# Patient Record
Sex: Male | Born: 1950 | Race: White | Hispanic: No | Smoking: Never smoker
Health system: Southern US, Community
[De-identification: ages and names within clinical notes are randomized; demographics above are authoritative.]

## PROBLEM LIST (undated history)

## (undated) DIAGNOSIS — H409 Unspecified glaucoma: Secondary | ICD-10-CM

## (undated) DIAGNOSIS — M1990 Unspecified osteoarthritis, unspecified site: Secondary | ICD-10-CM

## (undated) DIAGNOSIS — I10 Essential (primary) hypertension: Secondary | ICD-10-CM

## (undated) DIAGNOSIS — E785 Hyperlipidemia, unspecified: Secondary | ICD-10-CM

## (undated) DIAGNOSIS — C61 Malignant neoplasm of prostate: Secondary | ICD-10-CM

## (undated) HISTORY — DX: Unspecified glaucoma: H40.9

## (undated) HISTORY — PX: PROSTATE BIOPSY: SHX241

## (undated) HISTORY — PX: TONSILLECTOMY: SUR1361

## (undated) HISTORY — PX: WISDOM TOOTH EXTRACTION: SHX21

## (undated) HISTORY — DX: Hyperlipidemia, unspecified: E78.5

---

## 2003-09-26 ENCOUNTER — Ambulatory Visit (HOSPITAL_COMMUNITY): Admission: RE | Admit: 2003-09-26 | Discharge: 2003-09-26 | Payer: Self-pay | Admitting: Chiropractic Medicine

## 2015-02-21 DIAGNOSIS — H401112 Primary open-angle glaucoma, right eye, moderate stage: Secondary | ICD-10-CM | POA: Diagnosis not present

## 2015-02-21 DIAGNOSIS — H2513 Age-related nuclear cataract, bilateral: Secondary | ICD-10-CM | POA: Diagnosis not present

## 2015-03-11 DIAGNOSIS — H6693 Otitis media, unspecified, bilateral: Secondary | ICD-10-CM | POA: Diagnosis not present

## 2015-03-14 DIAGNOSIS — H8113 Benign paroxysmal vertigo, bilateral: Secondary | ICD-10-CM | POA: Diagnosis not present

## 2015-03-14 DIAGNOSIS — J069 Acute upper respiratory infection, unspecified: Secondary | ICD-10-CM | POA: Diagnosis not present

## 2015-03-14 DIAGNOSIS — K529 Noninfective gastroenteritis and colitis, unspecified: Secondary | ICD-10-CM | POA: Diagnosis not present

## 2015-04-11 DIAGNOSIS — H2513 Age-related nuclear cataract, bilateral: Secondary | ICD-10-CM | POA: Diagnosis not present

## 2015-04-11 DIAGNOSIS — H401111 Primary open-angle glaucoma, right eye, mild stage: Secondary | ICD-10-CM | POA: Diagnosis not present

## 2015-04-30 ENCOUNTER — Emergency Department (HOSPITAL_COMMUNITY): Payer: Medicare HMO

## 2015-04-30 ENCOUNTER — Emergency Department (HOSPITAL_COMMUNITY)
Admission: EM | Admit: 2015-04-30 | Discharge: 2015-04-30 | Disposition: A | Discharge status: Home / Self Care | Payer: Medicare HMO | Attending: Emergency Medicine | Admitting: Emergency Medicine

## 2015-04-30 ENCOUNTER — Encounter (HOSPITAL_COMMUNITY): Payer: Self-pay | Admitting: Oncology

## 2015-04-30 DIAGNOSIS — R079 Chest pain, unspecified: Secondary | ICD-10-CM | POA: Diagnosis present

## 2015-04-30 DIAGNOSIS — R0789 Other chest pain: Secondary | ICD-10-CM | POA: Diagnosis not present

## 2015-04-30 DIAGNOSIS — K8020 Calculus of gallbladder without cholecystitis without obstruction: Secondary | ICD-10-CM

## 2015-04-30 DIAGNOSIS — Z7982 Long term (current) use of aspirin: Secondary | ICD-10-CM | POA: Diagnosis not present

## 2015-04-30 DIAGNOSIS — I10 Essential (primary) hypertension: Secondary | ICD-10-CM | POA: Insufficient documentation

## 2015-04-30 HISTORY — DX: Essential (primary) hypertension: I10

## 2015-04-30 LAB — BASIC METABOLIC PANEL
Anion gap: 9 (ref 5–15)
BUN: 19 mg/dL (ref 6–20)
CHLORIDE: 106 mmol/L (ref 101–111)
CO2: 23 mmol/L (ref 22–32)
CREATININE: 1.21 mg/dL (ref 0.61–1.24)
Calcium: 9.1 mg/dL (ref 8.9–10.3)
GFR calc Af Amer: >60 mL/min (ref >60)
GFR calc non Af Amer: >60 mL/min (ref >60)
Glucose, Bld: 155 mg/dL — ABNORMAL HIGH (ref 65–99)
Potassium: 4.7 mmol/L (ref 3.5–5.1)
Sodium: 138 mmol/L (ref 135–145)

## 2015-04-30 LAB — I-STAT TROPONIN, ED: Troponin i, poc: 0 ng/mL (ref 0.00–0.08)

## 2015-04-30 LAB — URINALYSIS, ROUTINE W REFLEX MICROSCOPIC
Bilirubin Urine: NEGATIVE
GLUCOSE, UA: NEGATIVE mg/dL
HGB URINE DIPSTICK: NEGATIVE
Ketones, ur: NEGATIVE mg/dL
Leukocytes, UA: NEGATIVE
Nitrite: NEGATIVE
PH: 6 (ref 5.0–8.0)
PROTEIN: NEGATIVE mg/dL
Specific Gravity, Urine: >1.046 — ABNORMAL HIGH (ref 1.005–1.030)

## 2015-04-30 LAB — HEPATIC FUNCTION PANEL
ALBUMIN: 4.2 g/dL (ref 3.5–5.0)
ALK PHOS: 74 U/L (ref 38–126)
ALT: 27 U/L (ref 17–63)
AST: 33 U/L (ref 15–41)
BILIRUBIN INDIRECT: 0.6 mg/dL (ref 0.3–0.9)
BILIRUBIN TOTAL: 0.9 mg/dL (ref 0.3–1.2)
Bilirubin, Direct: 0.3 mg/dL (ref 0.1–0.5)
Total Protein: 6.9 g/dL (ref 6.5–8.1)

## 2015-04-30 LAB — CBC
HCT: 48.8 % (ref 39.0–52.0)
Hemoglobin: 16.5 g/dL (ref 13.0–17.0)
MCH: 31 pg (ref 26.0–34.0)
MCHC: 33.8 g/dL (ref 30.0–36.0)
MCV: 91.6 fL (ref 78.0–100.0)
PLATELETS: 255 10*3/uL (ref 150–400)
RBC: 5.33 MIL/uL (ref 4.22–5.81)
RDW: 13.4 % (ref 11.5–15.5)
WBC: 11.5 10*3/uL — ABNORMAL HIGH (ref 4.0–10.5)

## 2015-04-30 LAB — DIFFERENTIAL
Basophils Absolute: 0 10*3/uL (ref 0.0–0.1)
Basophils Relative: 0 %
EOS PCT: 1 %
Eosinophils Absolute: 0.1 10*3/uL (ref 0.0–0.7)
LYMPHS ABS: 1.7 10*3/uL (ref 0.7–4.0)
LYMPHS PCT: 15 %
MONO ABS: 0.7 10*3/uL (ref 0.1–1.0)
MONOS PCT: 6 %
Neutro Abs: 9 10*3/uL — ABNORMAL HIGH (ref 1.7–7.7)
Neutrophils Relative %: 78 %

## 2015-04-30 LAB — LIPASE, BLOOD: LIPASE: 30 U/L (ref 11–51)

## 2015-04-30 MED ORDER — ONDANSETRON HCL 4 MG/2ML IJ SOLN
4.0000 mg | Freq: Once | INTRAMUSCULAR | Status: AC
  Administered 2015-04-30: 4 mg via INTRAVENOUS
  Filled 2015-04-30: qty 2

## 2015-04-30 MED ORDER — MORPHINE SULFATE (PF) 4 MG/ML IV SOLN
4.0000 mg | Freq: Once | INTRAVENOUS | Status: AC
  Administered 2015-04-30: 4 mg via INTRAVENOUS
  Filled 2015-04-30: qty 1

## 2015-04-30 MED ORDER — IBUPROFEN 800 MG PO TABS: 800.0000 mg | ORAL_TABLET | Freq: Three times a day (TID) | ORAL | Status: DC | PRN

## 2015-04-30 MED ORDER — SODIUM CHLORIDE 0.9 % IV BOLUS (SEPSIS)
1000.0000 mL | Freq: Once | INTRAVENOUS | Status: AC
  Administered 2015-04-30: 1000 mL via INTRAVENOUS

## 2015-04-30 MED ORDER — OXYCODONE-ACETAMINOPHEN 5-325 MG PO TABS: 1.0000 | ORAL_TABLET | ORAL | Status: DC | PRN

## 2015-04-30 MED ORDER — ONDANSETRON 4 MG PO TBDP: 4.0000 mg | ORAL_TABLET | Freq: Three times a day (TID) | ORAL | Status: DC | PRN

## 2015-04-30 MED ORDER — IOHEXOL 350 MG/ML SOLN
125.0000 mL | Freq: Once | INTRAVENOUS | Status: AC | PRN
  Administered 2015-04-30: 125 mL via INTRAVENOUS

## 2015-04-30 MED ORDER — KETOROLAC TROMETHAMINE 30 MG/ML IJ SOLN
30.0000 mg | Freq: Once | INTRAMUSCULAR | Status: AC
  Administered 2015-04-30: 30 mg via INTRAVENOUS
  Filled 2015-04-30: qty 1
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
## 2015-04-30 NOTE — ED Notes (Signed)
Pt reports right sided chest pain w/ radiation to back and left shoulder since 2300 last night.  Pt is A&O x 4. Pt rates pain 5/10, pressure in nature.

## 2015-04-30 NOTE — Discharge Instructions (Signed)
Biliary Colic °Biliary colic is a pain in the upper abdomen. The pain: °· Is usually felt on the right side of the abdomen, but it may also be felt in the center of the abdomen, just below the breastbone (sternum). °· May spread back toward the right shoulder blade. °· May be steady or irregular. °· May be accompanied by nausea and vomiting. °Most of the time, the pain goes away in 1-5 hours. After the most intense pain passes, the abdomen may continue to ache mildly for about 24 hours. °Biliary colic is caused by a blockage in the bile duct. The bile duct is a pathway that carries bile--a liquid that helps to digest fats--from the gallbladder to the small intestine. Biliary colic usually occurs after eating, when the digestive system demands bile. The pain develops when muscle cells contract forcefully to try to move the blockage so that bile can get by. °HOME CARE INSTRUCTIONS °· Take medicines only as directed by your health care provider. °· Drink enough fluid to keep your urine clear or pale yellow. °· Avoid fatty, greasy, and fried foods. These kinds of foods increase your body's demand for bile. °· Avoid any foods that make your pain worse. °· Avoid overeating. °· Avoid having a large meal after fasting. °SEEK MEDICAL CARE IF: °· You develop a fever. °· Your pain gets worse. °· You vomit. °· You develop nausea that prevents you from eating and drinking. °SEEK IMMEDIATE MEDICAL CARE IF: °· You suddenly develop a fever and shaking chills. °· You develop a yellowish discoloration (jaundice) of: °· Skin. °· Whites of the eyes. °· Mucous membranes. °· You have continuous or severe pain that is not relieved with medicines. °· You have nausea and vomiting that is not relieved with medicines. °· You develop dizziness or you faint. °  °This information is not intended to replace advice given to you by your health care provider. Make sure you discuss any questions you have with your health care provider. °  °Document  Released: 09/30/2005 Document Revised: 09/13/2014 Document Reviewed: 02/08/2014 °Elsevier Interactive Patient Education ©2016 [employer] ° °Cholelithiasis °Cholelithiasis (also called gallstones) is a form of gallbladder disease in which gallstones form in your gallbladder. The gallbladder is an organ that stores bile made in the liver, which helps digest fats. Gallstones begin as small crystals and slowly grow into stones. Gallstone pain occurs when the gallbladder spasms and a gallstone is blocking the duct. Pain can also occur when a stone passes out of the duct.  °RISK FACTORS °· Being male.   °· Having multiple pregnancies. Health care providers sometimes advise removing diseased gallbladders before future pregnancies.   °· Being obese. °· Eating a diet heavy in fried foods and fat.   °· Being older than 60 years and increasing age.   °· Prolonged use of medicines containing male hormones.   °· Having diabetes mellitus.   °· Rapidly losing weight.   °· Having a family history of gallstones (heredity).   °SYMPTOMS °· Nausea.   °· Vomiting. °· Abdominal pain.   °· Yellowing of the skin (jaundice).   °· Sudden pain. It may persist from several minutes to several hours. °· Fever.   °· Tenderness to the touch.  °In some cases, when gallstones do not move into the bile duct, people have no pain or symptoms. These are called "silent" gallstones.  °TREATMENT °Silent gallstones do not need treatment. In severe cases, emergency surgery may be required. Options for treatment include: °· Surgery to remove the gallbladder. This is the most common treatment. °·   Medicines. These do not always work and may take 6-12 months or more to work. °· Shock wave treatment (extracorporeal biliary lithotripsy). In this treatment an ultrasound machine sends shock waves to the gallbladder to break gallstones into smaller pieces that can pass into the intestines or be dissolved by medicine. °HOME CARE INSTRUCTIONS  °· Only take  over-the-counter or prescription medicines for pain, discomfort, or fever as directed by your health care provider.   °· Follow a low-fat diet until seen again by your health care provider. Fat causes the gallbladder to contract, which can result in pain.   °· Follow up with your health care provider as directed. Attacks are almost always recurrent and surgery is usually required for permanent treatment.   °SEEK IMMEDIATE MEDICAL CARE IF:  °· Your pain increases and is not controlled by medicines.   °· You have a fever or persistent symptoms for more than 2-3 days.   °· You have a fever and your symptoms suddenly get worse.   °· You have persistent nausea and vomiting.   °MAKE SURE YOU:  °· Understand these instructions. °· Will watch your condition. °· Will get help right away if you are not doing well or get worse. °  °This information is not intended to replace advice given to you by your health care provider. Make sure you discuss any questions you have with your health care provider. °  °Document Released: 04/25/2005 Document Revised: 12/30/2012 Document Reviewed: 10/21/2012 °Elsevier Interactive Patient Education ©2016 [employer] °Low-Fat Diet for Pancreatitis or Gallbladder Conditions °A low-fat diet can be helpful if you have pancreatitis or a gallbladder condition. With these conditions, your pancreas and gallbladder have trouble digesting fats. A healthy eating plan with less fat will help rest your pancreas and gallbladder and reduce your symptoms. °WHAT DO I NEED TO KNOW ABOUT THIS DIET? °· Eat a low-fat diet. °¨ Reduce your fat intake to less than 20-30% of your total daily calories. This is less than 50-60 g of fat per day. °¨ Remember that you need some fat in your diet. Ask your dietician what your daily goal should be. °¨ Choose nonfat and low-fat healthy foods. Look for the words "nonfat," "low fat," or "fat free." °¨ As a guide, look on the label and choose foods with less than 3 g of fat per  serving. Eat only one serving. °· Avoid alcohol. °· Do not smoke. If you need help quitting, talk with your health care provider. °· Eat small frequent meals instead of three large heavy meals. °WHAT FOODS CAN I EAT? °Grains °Include healthy grains and starches such as potatoes, wheat bread, fiber-rich cereal, and brown rice. Choose whole grain options whenever possible. In adults, whole grains should account for 45-65% of your daily calories.  °Fruits and Vegetables °Eat plenty of fruits and vegetables. Fresh fruits and vegetables add fiber to your diet. °Meats and Other Protein Sources °Eat lean meat such as chicken and pork. Trim any fat off of meat before cooking it. Eggs, fish, and beans are other sources of protein. In adults, these foods should account for 10-35% of your daily calories. °Dairy °Choose low-fat milk and dairy options. Dairy includes fat and protein, as well as calcium.  °Fats and Oils °Limit high-fat foods such as fried foods, sweets, baked goods, sugary drinks.  °Other °Creamy sauces and condiments, such as mayonnaise, can add extra fat. Think about whether or not you need to use them, or use smaller amounts or low fat options. °WHAT FOODS ARE NOT RECOMMENDED? °·   High fat foods, such as: °¨ Baked goods. °¨ Ice cream. °¨ French toast. °¨ Sweet rolls. °¨ Pizza. °¨ Cheese bread. °¨ Foods covered with batter, butter, creamy sauces, or cheese. °¨ Fried foods. °¨ Sugary drinks and desserts. °· Foods that cause gas or bloating °  °This information is not intended to replace advice given to you by your health care provider. Make sure you discuss any questions you have with your health care provider. °  °Document Released: 05/04/2013 Document Reviewed: 05/04/2013 °Elsevier Interactive Patient Education ©2016 [employer] Inc. ° °

## 2015-05-01 ENCOUNTER — Emergency Department (HOSPITAL_COMMUNITY)
Admission: EM | Admit: 2015-05-01 | Discharge: 2015-05-01 | Disposition: A | Discharge status: Home / Self Care | Payer: Medicare HMO | Attending: Emergency Medicine | Admitting: Emergency Medicine

## 2015-05-01 ENCOUNTER — Encounter (HOSPITAL_COMMUNITY): Payer: Self-pay | Admitting: *Deleted

## 2015-05-01 DIAGNOSIS — Z7982 Long term (current) use of aspirin: Secondary | ICD-10-CM | POA: Diagnosis not present

## 2015-05-01 DIAGNOSIS — I10 Essential (primary) hypertension: Secondary | ICD-10-CM | POA: Insufficient documentation

## 2015-05-01 DIAGNOSIS — L299 Pruritus, unspecified: Secondary | ICD-10-CM | POA: Diagnosis present

## 2015-05-01 DIAGNOSIS — L509 Urticaria, unspecified: Secondary | ICD-10-CM

## 2015-05-01 DIAGNOSIS — Z79899 Other long term (current) drug therapy: Secondary | ICD-10-CM | POA: Insufficient documentation

## 2015-05-01 MED ORDER — PREDNISONE 20 MG PO TABS: 40.0000 mg | ORAL_TABLET | Freq: Every day | ORAL | Status: DC

## 2015-05-01 MED ORDER — DEXAMETHASONE SODIUM PHOSPHATE 10 MG/ML IJ SOLN
10.0000 mg | Freq: Once | INTRAMUSCULAR | Status: AC
  Administered 2015-05-01: 10 mg via INTRAMUSCULAR
  Filled 2015-05-01: qty 1

## 2015-05-01 MED ORDER — HYDROXYZINE HCL 25 MG PO TABS: 25.0000 mg | ORAL_TABLET | Freq: Four times a day (QID) | ORAL | Status: DC

## 2015-05-01 NOTE — ED Notes (Signed)
Pt states that he was seen here on Sunday for chest pain and was diagnosed with gallbladder issues; pt states that he began to have hives and flushed Sunday afternoon; pt states that it has progressed to having hives all over his body; pt c/o itching to the hives; pt states that the hives come and go; pt denies difficulty breathing or swallowing; pt states that he was afraid that would happen and wanted to be checked out

## 2015-05-01 NOTE — Discharge Instructions (Signed)
There does not appear to be an emergent cause for your rash at this time. Please take your medications as prescribed. Do not take the Vistaril in addition to the Benadryl, only use one or the other as these are similar medications and can make him very drowsy. Follow-up with your doctor in 2-3 days for reevaluation. Return to ED for any new or worsening symptoms including, but not limited to, chest pain, shortness of breath, nausea or vomiting, abdominal pain or cramping.  Allergies An allergy is an abnormal reaction to a substance by the body's defense system (immune system). Allergies can develop at any age. WHAT CAUSES ALLERGIES? An allergic reaction happens when the immune system mistakenly reacts to a normally harmless substance, called an allergen, as if it were harmful. The immune system releases antibodies to fight the substance. Antibodies eventually release a chemical called histamine into the bloodstream. The release of histamine is meant to protect the body from infection, but it also causes discomfort. An allergic reaction can be triggered by:  Eating an allergen.  Inhaling an allergen.  Touching an allergen. WHAT TYPES OF ALLERGIES ARE THERE? There are many types of allergies. Common types include:  Seasonal allergies. People with this type of allergy are usually allergic to substances that are only present during certain seasons, such as molds and pollens.  Food allergies.  Drug allergies.  Insect allergies.  Animal dander allergies. WHAT ARE SYMPTOMS OF ALLERGIES? Possible allergy symptoms include:  Swelling of the lips, face, tongue, mouth, or throat.  Sneezing, coughing, or wheezing.  Nasal congestion.  Tingling in the mouth.  Rash.  Itching.  Itchy, red, swollen areas of skin (hives).  Watery eyes.  Vomiting.  Diarrhea.  Dizziness.  Lightheadedness.  Fainting.  Trouble breathing or swallowing.  Chest tightness.  Rapid heartbeat. HOW ARE  ALLERGIES DIAGNOSED? Allergies are diagnosed with a medical and family history and one or more of the following:  Skin tests.  Blood tests.  A food diary. A food diary is a record of all the foods and drinks you have in a day and of all the symptoms you experience.  The results of an elimination diet. An elimination diet involves eliminating foods from your diet and then adding them back in one by one to find out if a certain food causes an allergic reaction. HOW ARE ALLERGIES TREATED? There is no cure for allergies, but allergic reactions can be treated with medicine. Severe reactions usually need to be treated at a hospital. HOW CAN REACTIONS BE PREVENTED? The best way to prevent an allergic reaction is by avoiding the substance you are allergic to. Allergy shots and medicines can also help prevent reactions in some cases. People with severe allergic reactions may be able to prevent a life-threatening reaction called anaphylaxis with a medicine given right after exposure to the allergen.   This information is not intended to replace advice given to you by your health care provider. Make sure you discuss any questions you have with your health care provider.   Document Released: 07/23/2002 Document Revised: 05/20/2014 Document Reviewed: 02/08/2014 Elsevier Interactive Patient Education [employer].

## 2015-05-01 NOTE — ED Provider Notes (Signed)
CSN: [id number]     Arrival date & time 05/01/15  1910 History  By signing my name below, I, Shane Myers, attest that this documentation has been prepared under the direction and in the presence of [employer], PA-C. Electronically Signed: Starleen Myers ED Scribe. 05/01/2015. 8:39 PM.    Chief Complaint  Patient presents with  . Urticaria   The history is provided by the patient. No language interpreter was used.   HPI Comments: Shane Myers is a 64 y.o. male who presents to the Emergency Department complaining of persistent itching hives on his Myers, chest, back, and neck onset this morning; unchanged with two doses of 25 mg benadryl (9:00 am and 1:00 PM today).  He reports feeling flushed yesterday evening but denies other symptoms.  The patient was seen in the ED yesterday morning for CP which was attributed to gallstones.  At that time, patient received CT chest and CT abdomen, both with contrast.  He was treated with IV morphine and discharged with Percocet and Zofran.  He denies current CP, n/v, SOB or difficulty breathing, abdominal pain or cramping, other skin manifestations.  NKA. Past Medical History  Diagnosis Date  . HTN (hypertension)    Past Surgical History  Procedure Laterality Date  . Tonsillectomy     No family history on file. Social History  Substance Use Topics  . Smoking status: Never Smoker   . Smokeless tobacco: Never Used  . Alcohol Use: Yes    Review of Systems 10 Systems reviewed and all are negative for acute change except as noted in the HPI.  Allergies  Review of patient's allergies indicates no known allergies.  Home Medications   Prior to Admission medications   Medication Sig Start Date End Date Taking? Authorizing Provider  aspirin 81 MG chewable tablet Chew 324 mg by mouth daily as needed for mild pain.    Historical Provider, MD  hydrOXYzine (ATARAX/VISTARIL) 25 MG tablet Take 1 tablet (25 mg total) by mouth every 6 (six) hours.  05/01/15   Comer Locket, PA-C  ibuprofen (ADVIL,MOTRIN) 800 MG tablet Take 1 tablet (800 mg total) by mouth every 8 (eight) hours as needed for mild pain. 04/30/15   Kristen N Ward, DO  ondansetron (ZOFRAN ODT) 4 MG disintegrating tablet Take 1 tablet (4 mg total) by mouth every 8 (eight) hours as needed for nausea or vomiting. 04/30/15   Kristen N Ward, DO  oxyCODONE-acetaminophen (PERCOCET/ROXICET) 5-325 MG tablet Take 1-2 tablets by mouth every 4 (four) hours as needed. 04/30/15   Kristen N Ward, DO  predniSONE (DELTASONE) 20 MG tablet Take 2 tablets (40 mg total) by mouth daily. 05/01/15   Comer Locket, PA-C  timolol (TIMOPTIC) 0.5 % ophthalmic solution Place 1 drop into the right eye 2 (two) times daily. 02/21/15   Historical Provider, MD   BP 163/85 mmHg  Pulse 93  Temp(Src) 98.3 F (36.8 C) (Oral)  Resp 20  Wt 92.987 kg  SpO2 95% Physical Exam  Constitutional: He is oriented to person, place, and time. He appears well-developed and well-nourished. No distress.  HENT:  Head: Normocephalic and atraumatic.  Mouth/Throat: Oropharynx is clear and moist.  Eyes: Conjunctivae and EOM are normal.  Neck: Neck supple. No tracheal deviation present.  Cardiovascular: Normal rate.   Pulmonary/Chest: Effort normal. No respiratory distress.  Musculoskeletal: Normal range of motion.  Neurological: He is alert and oriented to person, place, and time.  Skin: Skin is warm and dry.  Diffuse, urticarial rash  to Myers, anterior posterior trunk, chest, back. No scaling. No mucous membrane involvement. No petechiae. No scaling or sloughing.  Psychiatric: He has a normal mood and affect. His behavior is normal.  Nursing note and vitals reviewed.   ED Course  Procedures (including critical care time)  DIAGNOSTIC STUDIES: Oxygen Saturation is 95% on RA, adequate by my interpretation.    COORDINATION OF CARE:  8:44 PM Discussed treatment plan with patient at bedside.  Patient acknowledges and  agrees with plan.    Labs Review Labs Reviewed - No data to display  Imaging Review Dg Chest 2 View  04/30/2015  CLINICAL DATA:  Right-sided chest pain radiating to the back and left shoulder since 2300 hours last night. Nonsmoker. Hypertension. EXAM: CHEST  2 VIEW COMPARISON:  None. FINDINGS: Normal heart size and pulmonary vascularity. No focal airspace disease or consolidation in the lungs. No blunting of costophrenic angles. No pneumothorax. Mediastinal contours appear intact. Degenerative changes in the spine. IMPRESSION: No active cardiopulmonary disease. Electronically Signed   By: Lucienne Capers M.D.   On: 04/30/2015 04:55   Ct Angio Chest Aorta W/cm &/or Wo/cm  04/30/2015  CLINICAL DATA:  Right-sided chest pain radiating to the back and left shoulder since 2300 hours last night. EXAM: CT ANGIOGRAPHY CHEST, ABDOMEN AND PELVIS TECHNIQUE: Multidetector CT imaging through the chest, abdomen and pelvis was performed using the standard protocol during bolus administration of intravenous contrast. Multiplanar reconstructed images and MIPs were obtained and reviewed to evaluate the vascular anatomy. CONTRAST:  19mL OMNIPAQUE IOHEXOL 350 MG/ML SOLN COMPARISON:  None. FINDINGS: CTA CHEST FINDINGS Noncontrast imaging of the chest demonstrate normal caliber thoracic aorta. No intramural hematoma. Scattered aortic calcifications. Calcification of the coronary arteries. Images obtained after contrast administration during the arterial phase demonstrate normal caliber thoracic aorta. No evidence of aortic dissection. Great vessel origins are patent. Central pulmonary arteries are patent without evidence of pulmonary embolus. Normal heart size. Esophagus is decompressed. There are scattered prominent lymph nodes demonstrated in the axillary regions, supraclavicular regions, and thoracic inlet bilaterally. These are not pathologically enlarged are likely reactive. Mild dependent atelectasis in the lung bases.  No focal airspace disease or consolidation. No pleural effusions. No pneumothorax. Airways appear patent. Review of the MIP images confirms the above findings. CTA ABDOMEN AND PELVIS FINDINGS Normal caliber abdominal aorta with mild calcification. No aortic aneurysm or dissection. 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 Renal nephrograms are symmetrical Small esophageal hiatal hernia. Diffuse fatty infiltration of the liver. Cholelithiasis with small stones in the gallbladder neck. No gallbladder wall thickening or infiltration. The spleen, pancreas, adrenal glands, inferior vena cava, and retroperitoneal lymph nodes are unremarkable. Cyst in the upper pole right kidney. No hydronephrosis in either kidney. Stomach, small bowel, and colon are not abnormally distended. No free air or free fluid in the abdomen. Small gastric diverticulum. Pelvis: Prostate gland is not enlarged. Bladder wall is not thickened. No free or loculated pelvic fluid collections. No pelvic mass or lymphadenopathy. Fat in the inguinal canals. Bones: Normal alignment of the thoracic and lumbar spine. Mild degenerative changes in the spine. No destructive bone lesions. Review of the MIP images confirms the above findings. IMPRESSION: No evidence of aneurysm in the thoracic or abdominal aorta. No evidence of active pulmonary disease. Diffuse fatty infiltration of the liver. Cholelithiasis. Cyst in the  upper pole right kidney. Electronically Signed   By: Lucienne Capers M.D.   On: 04/30/2015 07:01   Ct Cta Abd/pel W/cm &/or W/o Cm  04/30/2015  CLINICAL DATA:  Right-sided chest pain radiating to the back and left shoulder since 2300 hours last night. EXAM: CT ANGIOGRAPHY CHEST, ABDOMEN AND PELVIS TECHNIQUE: Multidetector CT imaging through  the chest, abdomen and pelvis was performed using the standard protocol during bolus administration of intravenous contrast. Multiplanar reconstructed images and MIPs were obtained and reviewed to evaluate the vascular anatomy. CONTRAST:  178mL OMNIPAQUE IOHEXOL 350 MG/ML SOLN COMPARISON:  None. FINDINGS: CTA CHEST FINDINGS Noncontrast imaging of the chest demonstrate normal caliber thoracic aorta. No intramural hematoma. Scattered aortic calcifications. Calcification of the coronary arteries. Images obtained after contrast administration during the arterial phase demonstrate normal caliber thoracic aorta. No evidence of aortic dissection. Great vessel origins are patent. Central pulmonary arteries are patent without evidence of pulmonary embolus. Normal heart size. Esophagus is decompressed. There are scattered prominent lymph nodes demonstrated in the axillary regions, supraclavicular regions, and thoracic inlet bilaterally. These are not pathologically enlarged are likely reactive. Mild dependent atelectasis in the lung bases. No focal airspace disease or consolidation. No pleural effusions. No pneumothorax. Airways appear patent. Review of the MIP images confirms the above findings. CTA ABDOMEN AND PELVIS FINDINGS Normal caliber abdominal aorta with mild calcification. No aortic aneurysm or dissection. 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 Renal nephrograms are symmetrical Small esophageal hiatal hernia. Diffuse fatty infiltration of the liver. Cholelithiasis with small stones in the gallbladder neck. No gallbladder wall thickening or infiltration. The spleen, pancreas, adrenal glands, inferior vena cava, and retroperitoneal lymph nodes are unremarkable. Cyst in the upper pole right kidney. No  hydronephrosis in either kidney. Stomach, small bowel, and colon are not abnormally distended. No free air or free fluid in the abdomen. Small gastric diverticulum. Pelvis: Prostate gland is not enlarged. Bladder wall is not thickened. No free or loculated pelvic fluid collections. No pelvic mass or lymphadenopathy. Fat in the inguinal canals. Bones: Normal alignment of the thoracic and lumbar spine. Mild degenerative changes in the spine. No destructive bone lesions. Review of the MIP images confirms the above findings. IMPRESSION: No evidence of aneurysm in the thoracic or abdominal aorta. No evidence of active pulmonary disease. Diffuse fatty infiltration of the liver. Cholelithiasis. Cyst in the upper pole right kidney. Electronically Signed   By: Lucienne Capers M.D.   On: 04/30/2015 07:01   I have personally reviewed and evaluated these images and lab results as part of my medical decision-making.   EKG Interpretation None     Meds given in ED:  Medications  dexamethasone (DECADRON) injection 10 mg (10 mg Intramuscular Given 05/01/15 2059)    Discharge Medication List as of 05/01/2015  9:05 PM    START taking these medications   Details  hydrOXYzine (ATARAX/VISTARIL) 25 MG tablet Take 1 tablet (25 mg total) by mouth every 6 (six) hours., Starting 05/01/2015, Until Discontinued, Print    predniSONE (DELTASONE) 20 MG tablet Take 2 tablets (40 mg total) by mouth daily., Starting 05/01/2015, Until Discontinued, Print       Filed Vitals:   05/01/15 1928  BP: 163/85  Pulse: 93  Temp: 98.3 F (36.8 C)  TempSrc: Oral  Resp: 20  Weight: 92.987 kg  SpO2:  95%    MDM  AMAL PALMATIER is a 64 y.o. male history of hypertension comes in for evaluation of hives. Patient has no known allergies. No other symptoms. No evidence of anaphylaxis. Patient has patent airway. Rash not concerning for Stevens-Johnson's, TEN or other emergent rash. Patient denies any other discomfort whatsoever in ED.  Will treat with IM Decadron in the ED and given small burst pack at home. Also given a prescription for Vistaril to help with itching. Discussed do not take this in conjunction with Benadryl. Follow-up with your doctor in the next 2-3 days for further evaluation. Return precautions given. Overall, patient appears well, nontoxic, hemodynamically stable, afebrile and is appropriate for discharge. Final diagnoses:  Urticaria  Patient re-evaluated prior to dc, is hemodynamically stable, in no respiratory distress, and denies the feeling of throat closing, normal phonation. No wheezing, no vomiting, no syncope. Discussed signs and symptoms of anaphylaxis and severe allergic reaction. Pt advised to return for any worsening in symptoms or any concerns. Pt is to follow up with their PCP. Pt is agreeable with plan & verbalizes understanding.   I personally performed the services described in this documentation, which was scribed in my presence. The recorded information has been reviewed and is accurate.    Comer Locket, PA-C 05/02/15 Evan, MD 05/04/15 [phone]

## 2015-05-14 DIAGNOSIS — Z8719 Personal history of other diseases of the digestive system: Secondary | ICD-10-CM

## 2015-05-14 HISTORY — PX: CATARACT EXTRACTION: SUR2

## 2015-05-14 HISTORY — DX: Personal history of other diseases of the digestive system: Z87.19

## 2015-07-04 DIAGNOSIS — H25011 Cortical age-related cataract, right eye: Secondary | ICD-10-CM | POA: Diagnosis not present

## 2015-07-04 DIAGNOSIS — H2513 Age-related nuclear cataract, bilateral: Secondary | ICD-10-CM | POA: Diagnosis not present

## 2015-08-02 DIAGNOSIS — H2511 Age-related nuclear cataract, right eye: Secondary | ICD-10-CM | POA: Diagnosis not present

## 2015-08-02 DIAGNOSIS — H25811 Combined forms of age-related cataract, right eye: Secondary | ICD-10-CM | POA: Diagnosis not present

## 2015-10-03 DIAGNOSIS — D225 Melanocytic nevi of trunk: Secondary | ICD-10-CM | POA: Diagnosis not present

## 2015-10-03 DIAGNOSIS — Z1283 Encounter for screening for malignant neoplasm of skin: Secondary | ICD-10-CM | POA: Diagnosis not present

## 2015-10-03 DIAGNOSIS — L821 Other seborrheic keratosis: Secondary | ICD-10-CM | POA: Diagnosis not present

## 2015-10-03 DIAGNOSIS — B078 Other viral warts: Secondary | ICD-10-CM | POA: Diagnosis not present

## 2015-10-10 DIAGNOSIS — Z0100 Encounter for examination of eyes and vision without abnormal findings: Secondary | ICD-10-CM | POA: Diagnosis not present

## 2015-10-24 DIAGNOSIS — H401112 Primary open-angle glaucoma, right eye, moderate stage: Secondary | ICD-10-CM | POA: Diagnosis not present

## 2015-10-24 DIAGNOSIS — H401122 Primary open-angle glaucoma, left eye, moderate stage: Secondary | ICD-10-CM | POA: Diagnosis not present

## 2016-02-02 DIAGNOSIS — R109 Unspecified abdominal pain: Secondary | ICD-10-CM | POA: Diagnosis not present

## 2016-02-22 DIAGNOSIS — H401111 Primary open-angle glaucoma, right eye, mild stage: Secondary | ICD-10-CM | POA: Diagnosis not present

## 2016-05-08 ENCOUNTER — Telehealth: Payer: Self-pay | Admitting: General Practice

## 2016-05-08 NOTE — Telephone Encounter (Signed)
Unfortunately, I'm not able to accept any more new patients at this time.  I'm sorry! Thank you!  

## 2016-05-08 NOTE — Telephone Encounter (Signed)
Patient would like to know if Dr. Alain Marion would take him on as a patient?  Please advise.

## 2016-05-10 NOTE — Telephone Encounter (Signed)
Left vm to notify patient 

## 2016-06-12 DIAGNOSIS — H9193 Unspecified hearing loss, bilateral: Secondary | ICD-10-CM | POA: Diagnosis not present

## 2016-06-12 DIAGNOSIS — H401122 Primary open-angle glaucoma, left eye, moderate stage: Secondary | ICD-10-CM | POA: Diagnosis not present

## 2016-06-12 DIAGNOSIS — H25092 Other age-related incipient cataract, left eye: Secondary | ICD-10-CM | POA: Diagnosis not present

## 2016-06-12 DIAGNOSIS — K649 Unspecified hemorrhoids: Secondary | ICD-10-CM | POA: Diagnosis not present

## 2016-06-12 DIAGNOSIS — Z91041 Radiographic dye allergy status: Secondary | ICD-10-CM | POA: Diagnosis not present

## 2016-06-12 DIAGNOSIS — Z87442 Personal history of urinary calculi: Secondary | ICD-10-CM | POA: Diagnosis not present

## 2016-06-12 DIAGNOSIS — I10 Essential (primary) hypertension: Secondary | ICD-10-CM | POA: Diagnosis not present

## 2016-07-02 DIAGNOSIS — I10 Essential (primary) hypertension: Secondary | ICD-10-CM | POA: Diagnosis not present

## 2016-07-04 DIAGNOSIS — H40009 Preglaucoma, unspecified, unspecified eye: Secondary | ICD-10-CM | POA: Diagnosis not present

## 2016-07-04 DIAGNOSIS — Z6830 Body mass index (BMI) 30.0-30.9, adult: Secondary | ICD-10-CM | POA: Diagnosis not present

## 2016-07-04 DIAGNOSIS — I10 Essential (primary) hypertension: Secondary | ICD-10-CM | POA: Diagnosis not present

## 2016-07-04 DIAGNOSIS — H9113 Presbycusis, bilateral: Secondary | ICD-10-CM | POA: Diagnosis not present

## 2016-07-04 DIAGNOSIS — E669 Obesity, unspecified: Secondary | ICD-10-CM | POA: Diagnosis not present

## 2016-07-04 DIAGNOSIS — G4700 Insomnia, unspecified: Secondary | ICD-10-CM | POA: Diagnosis not present

## 2016-07-04 DIAGNOSIS — Z0000 Encounter for general adult medical examination without abnormal findings: Secondary | ICD-10-CM | POA: Diagnosis not present

## 2016-07-09 DIAGNOSIS — H401112 Primary open-angle glaucoma, right eye, moderate stage: Secondary | ICD-10-CM | POA: Diagnosis not present

## 2016-07-23 DIAGNOSIS — I10 Essential (primary) hypertension: Secondary | ICD-10-CM | POA: Diagnosis not present

## 2016-07-23 DIAGNOSIS — Z79899 Other long term (current) drug therapy: Secondary | ICD-10-CM | POA: Diagnosis not present

## 2016-08-08 DIAGNOSIS — J028 Acute pharyngitis due to other specified organisms: Secondary | ICD-10-CM | POA: Diagnosis not present

## 2016-08-08 DIAGNOSIS — R05 Cough: Secondary | ICD-10-CM | POA: Diagnosis not present

## 2016-08-08 DIAGNOSIS — M545 Low back pain: Secondary | ICD-10-CM | POA: Diagnosis not present

## 2016-08-13 DIAGNOSIS — N289 Disorder of kidney and ureter, unspecified: Secondary | ICD-10-CM | POA: Diagnosis not present

## 2016-08-27 DIAGNOSIS — I10 Essential (primary) hypertension: Secondary | ICD-10-CM | POA: Diagnosis not present

## 2016-09-17 DIAGNOSIS — I10 Essential (primary) hypertension: Secondary | ICD-10-CM | POA: Diagnosis not present

## 2016-10-04 ENCOUNTER — Emergency Department (HOSPITAL_COMMUNITY): Payer: Medicare HMO

## 2016-10-04 ENCOUNTER — Emergency Department (HOSPITAL_COMMUNITY)
Admission: EM | Admit: 2016-10-04 | Discharge: 2016-10-04 | Disposition: A | Discharge status: Home / Self Care | Payer: Medicare HMO | Attending: Emergency Medicine | Admitting: Emergency Medicine

## 2016-10-04 ENCOUNTER — Encounter (HOSPITAL_COMMUNITY): Payer: Self-pay | Admitting: Emergency Medicine

## 2016-10-04 DIAGNOSIS — R1110 Vomiting, unspecified: Secondary | ICD-10-CM | POA: Diagnosis not present

## 2016-10-04 DIAGNOSIS — K8020 Calculus of gallbladder without cholecystitis without obstruction: Secondary | ICD-10-CM | POA: Diagnosis not present

## 2016-10-04 DIAGNOSIS — Z79899 Other long term (current) drug therapy: Secondary | ICD-10-CM | POA: Diagnosis not present

## 2016-10-04 DIAGNOSIS — K829 Disease of gallbladder, unspecified: Secondary | ICD-10-CM | POA: Diagnosis not present

## 2016-10-04 DIAGNOSIS — I10 Essential (primary) hypertension: Secondary | ICD-10-CM | POA: Insufficient documentation

## 2016-10-04 DIAGNOSIS — R1084 Generalized abdominal pain: Secondary | ICD-10-CM | POA: Diagnosis present

## 2016-10-04 DIAGNOSIS — R1111 Vomiting without nausea: Secondary | ICD-10-CM | POA: Diagnosis not present

## 2016-10-04 DIAGNOSIS — R1010 Upper abdominal pain, unspecified: Secondary | ICD-10-CM | POA: Diagnosis not present

## 2016-10-04 DIAGNOSIS — R609 Edema, unspecified: Secondary | ICD-10-CM | POA: Diagnosis not present

## 2016-10-04 LAB — COMPREHENSIVE METABOLIC PANEL
ALBUMIN: 4.3 g/dL (ref 3.5–5.0)
ALK PHOS: 69 U/L (ref 38–126)
ALT: 28 U/L (ref 17–63)
AST: 23 U/L (ref 15–41)
Anion gap: 12 (ref 5–15)
BILIRUBIN TOTAL: 0.7 mg/dL (ref 0.3–1.2)
BUN: 19 mg/dL (ref 6–20)
CALCIUM: 9 mg/dL (ref 8.9–10.3)
CO2: 22 mmol/L (ref 22–32)
CREATININE: 1.15 mg/dL (ref 0.61–1.24)
Chloride: 104 mmol/L (ref 101–111)
GFR calc non Af Amer: >60 mL/min (ref >60)
GLUCOSE: 169 mg/dL — AB (ref 65–99)
Potassium: 3.4 mmol/L — ABNORMAL LOW (ref 3.5–5.1)
SODIUM: 138 mmol/L (ref 135–145)
Total Protein: 7.4 g/dL (ref 6.5–8.1)

## 2016-10-04 LAB — CBC
HCT: 45.8 % (ref 39.0–52.0)
Hemoglobin: 16.1 g/dL (ref 13.0–17.0)
MCH: 31.7 pg (ref 26.0–34.0)
MCHC: 35.2 g/dL (ref 30.0–36.0)
MCV: 90.2 fL (ref 78.0–100.0)
PLATELETS: 288 10*3/uL (ref 150–400)
RBC: 5.08 MIL/uL (ref 4.22–5.81)
RDW: 13.4 % (ref 11.5–15.5)
WBC: 13.2 10*3/uL — ABNORMAL HIGH (ref 4.0–10.5)

## 2016-10-04 LAB — URINALYSIS, ROUTINE W REFLEX MICROSCOPIC
BILIRUBIN URINE: NEGATIVE
Glucose, UA: NEGATIVE mg/dL
HGB URINE DIPSTICK: NEGATIVE
KETONES UR: NEGATIVE mg/dL
Leukocytes, UA: NEGATIVE
Nitrite: NEGATIVE
PROTEIN: NEGATIVE mg/dL
Specific Gravity, Urine: 1.016 (ref 1.005–1.030)
pH: 5 (ref 5.0–8.0)

## 2016-10-04 LAB — LIPASE, BLOOD: Lipase: 26 U/L (ref 11–51)

## 2016-10-04 MED ORDER — ONDANSETRON HCL 4 MG/2ML IJ SOLN
4.0000 mg | Freq: Once | INTRAMUSCULAR | Status: AC
  Administered 2016-10-04: 4 mg via INTRAVENOUS
  Filled 2016-10-04: qty 2

## 2016-10-04 MED ORDER — METOCLOPRAMIDE HCL 5 MG/ML IJ SOLN
5.0000 mg | Freq: Once | INTRAMUSCULAR | Status: AC
  Administered 2016-10-04: 5 mg via INTRAVENOUS
  Filled 2016-10-04: qty 2

## 2016-10-04 MED ORDER — SODIUM CHLORIDE 0.9 % IV BOLUS (SEPSIS)
500.0000 mL | Freq: Once | INTRAVENOUS | Status: AC
  Administered 2016-10-04: 500 mL via INTRAVENOUS

## 2016-10-04 MED ORDER — HYDROMORPHONE HCL 1 MG/ML IJ SOLN
1.0000 mg | Freq: Once | INTRAMUSCULAR | Status: AC
  Administered 2016-10-04: 1 mg via INTRAVENOUS
  Filled 2016-10-04: qty 1

## 2016-10-04 MED ORDER — FAMOTIDINE IN NACL 20-0.9 MG/50ML-% IV SOLN
20.0000 mg | Freq: Once | INTRAVENOUS | Status: AC
  Administered 2016-10-04: 20 mg via INTRAVENOUS
  Filled 2016-10-04: qty 50

## 2016-10-04 MED ORDER — OXYCODONE-ACETAMINOPHEN 5-325 MG PO TABS: 1.0000 | ORAL_TABLET | ORAL | 0 refills | Status: DC | PRN

## 2016-10-04 NOTE — ED Notes (Signed)
Utrasound at bedside.

## 2016-10-04 NOTE — ED Provider Notes (Signed)
[facility] DEPT Provider Note   CSN: [id number] Arrival date & time: 10/04/16  0240  By signing my name below, I, Jaquelyn Bitter., attest that this documentation has been prepared under the direction and in the presence of Zamorah Ailes, Gwenyth Allegra, *. Electronically signed: Jaquelyn Bitter., ED Scribe. 10/04/16. 6:39 AM.   History   Chief Complaint Chief Complaint  Patient presents with  . Abdominal Pain    HPI Shane Myers is a 66 y.o. male who presents to the Emergency Department complaining of abdominal pain with sudden onset x3 hours. Pt states that he went to bed feeling normal but awoke x3 hours ago with abdominal pain. Pt states that since he awoke he has has x10-12 episodes of vomiting. He reports back pain, abdominal distention. Pt denies any modifying factors. He denies diarrhea. Of note, pt denies any pshx to the abdomen, hx of bowel blockage.   The history is provided by the patient. No language interpreter was used.    Past Medical History:  Diagnosis Date  . HTN (hypertension)     There are no active problems to display for this patient.   Past Surgical History:  Procedure Laterality Date  . TONSILLECTOMY         Home Medications    Prior to Admission medications   Medication Sig Start Date End Date Taking? Authorizing Provider  amLODipine (NORVASC) 10 MG tablet Take 10 mg by mouth daily.   Yes [provider]  timolol (TIMOPTIC) 0.5 % ophthalmic solution Place 1 drop into the right eye at bedtime.    Yes [provider]    Family History History reviewed. No pertinent family history.  Social History Social History  Substance Use Topics  . Smoking status: Never Smoker  . Smokeless tobacco: Never Used  . Alcohol use Yes     Allergies   Ivp dye [iodinated diagnostic agents]   Review of Systems Review of Systems  Constitutional: Negative for fever.  Gastrointestinal: Positive for abdominal distention,  abdominal pain, nausea and vomiting. Negative for diarrhea.  Musculoskeletal: Positive for back pain.     Physical Exam Updated Vital Signs BP (!) 161/78 (BP Location: Left Arm)   Pulse 90   Temp 97.8 F (36.6 C) (Oral)   Resp 18   Ht 5\' 11"  (1.803 m)   Wt 99.8 kg (220 lb)   SpO2 95%   BMI 30.68 kg/m   Physical Exam  Constitutional: He is oriented to person, place, and time. He appears well-developed and well-nourished. No distress.  HENT:  Head: Normocephalic and atraumatic.  Right Ear: Hearing normal.  Left Ear: Hearing normal.  Nose: Nose normal.  Mouth/Throat: Oropharynx is clear and moist and mucous membranes are normal.  Eyes: Conjunctivae and EOM are normal. Pupils are equal, round, and reactive to light.  Neck: Normal range of motion. Neck supple.  Cardiovascular: Regular rhythm, S1 normal and S2 normal.  Exam reveals no gallop and no friction rub.   No murmur heard. Pulmonary/Chest: Effort normal and breath sounds normal. No respiratory distress. He exhibits no tenderness.  Abdominal: Soft. Normal appearance. He exhibits distension. Bowel sounds are increased. There is no hepatosplenomegaly. There is no tenderness. There is no rebound, no guarding, no tenderness at McBurney's point and negative Murphy's sign. No hernia.  Slightly typanitic with diffuse tenderness but without guarding or rebound.   Musculoskeletal: Normal range of motion.  Neurological: He is alert and oriented to person, place, and time. He has  normal strength. No cranial nerve deficit or sensory deficit. Coordination normal. GCS eye subscore is 4. GCS verbal subscore is 5. GCS motor subscore is 6.  Skin: Skin is warm, dry and intact. No rash noted. No cyanosis.  Psychiatric: He has a normal mood and affect. His speech is normal and behavior is normal. Thought content normal.  Nursing note and vitals reviewed.    ED Treatments / Results   DIAGNOSTIC STUDIES: Oxygen Saturation is 98% on RA, normal  by my interpretation.   COORDINATION OF CARE: 6:39 AM-Discussed next steps with pt. Pt verbalized understanding and is agreeable with the plan.    Labs (all labs ordered are listed, but only abnormal results are displayed) Labs Reviewed  COMPREHENSIVE METABOLIC PANEL - Abnormal; Notable for the following:       Result Value   Potassium 3.4 (*)    Glucose, Bld 169 (*)    All other components within normal limits  CBC - Abnormal; Notable for the following:    WBC 13.2 (*)    All other components within normal limits  LIPASE, BLOOD  URINALYSIS, ROUTINE W REFLEX MICROSCOPIC    EKG  EKG Interpretation None       Radiology Ct Abdomen Pelvis Wo Contrast  Result Date: 10/04/2016 CLINICAL DATA:  Initial evaluation for acute abdominal pain, vomiting, abdominal distension. EXAM: CT ABDOMEN AND PELVIS WITHOUT CONTRAST TECHNIQUE: Multidetector CT imaging of the abdomen and pelvis was performed following the standard protocol without IV contrast. COMPARISON:  None. FINDINGS: Lower chest: Scattered bibasilar opacities within the visualized lung bases most consistent with atelectasis. Visualized lungs are otherwise clear. Hepatobiliary: Limited noncontrast evaluation liver unremarkable. Punctate calcification at the gallbladder neck suspicious for a small stone (series 4, image 72). Gallbladder itself without acute inflammatory changes. No biliary dilatation. Pancreas: Pancreas within normal limits. Spleen: Spleen within normal limits. Adrenals/Urinary Tract: Adrenal glands are normal. Kidneys equal in size without evidence for nephrolithiasis or hydronephrosis. 5.1 cm exophytic cyst seen at the upper pole the right kidney. No hydroureter. Bladder within normal limits. Stomach/Bowel: Small hiatal hernia. Small diverticulum seen extending from the posterior aspect of the gastric fundus (series 2, image 20). No associated inflammation. Oral contrast with retained barium within the gastric lumen. No  evidence for bowel obstruction. Appendix within normal limits. No acute inflammatory changes seen about the bowels. Vascular/Lymphatic: Mild air bi-iliac atherosclerotic disease. No aneurysm. No adenopathy. Reproductive: Prostate within normal limits. Other: No free air or fluid. Small fat containing paraumbilical hernia noted. Additional small bilateral fat containing inguinal hernias noted. Musculoskeletal: No acute osseus abnormality. No worrisome lytic or blastic osseous lesions. IMPRESSION: 1. No CT evidence for acute intra-abdominal or pelvic process. 2. Punctate calcification in the gallbladder neck, suspicious for a small gallstone. No biliary dilatation or significant inflammatory changes about the gallbladder. Finding could be further assessed with dedicated right upper quadrant ultrasound as desired. 3. Small diverticulum at the gastric fundus without associated inflammation. 4. Bibasilar atelectasis. Electronically Signed   By: Jeannine Boga M.D.   On: 10/04/2016 06:26    Procedures Procedures (including critical care time)  Medications Ordered in ED Medications  HYDROmorphone (DILAUDID) injection 1 mg (1 mg Intravenous Given 10/04/16 0359)  ondansetron (ZOFRAN) injection 4 mg (4 mg Intravenous Given 10/04/16 0359)  sodium chloride 0.9 % bolus 500 mL (0 mLs Intravenous Stopped 10/04/16 0545)     Initial Impression / Assessment and Plan / ED Course  I have reviewed the triage vital signs and the nursing  notes.  Pertinent labs & imaging results that were available during my care of the patient were reviewed by me and considered in my medical decision making (see chart for details).     Patient presents to the ER for evaluation of abdominal pain with nausea and vomiting. Patient reports multiple episodes of emesis overnight. He had been feeling well when he went to bed, has not had similar symptoms previously. Examination revealed epigastric tenderness initially. Patient given IV  Dilaudid and IV fluids. He reports that it has "dulled the pain" but still experiencing pain. Pain is still upper abdomen with some tenderness in the right upper, left upper quadrant as well as epigastric region.  CT performed. There is evidence of possible small gallstone without evidence of acute cholecystitis. As patient is still experiencing pain, will obtain right upper quadrant ultrasound. Will sign out to oncoming ER physician to follow up US and patient progress.  Final Clinical Impressions(s) / ED Diagnoses   Final diagnoses:  Pain of upper abdomen    New Prescriptions New Prescriptions   No medications on file   I personally performed the services described in this documentation, which was scribed in my presence. The recorded information has been reviewed and is accurate.     Orpah Greek, MD 10/04/16 [phone]

## 2016-10-04 NOTE — ED Triage Notes (Signed)
Pt reports having abd pain along with nausea and vomiting. Pt denies any diarrhea at this time. Pt reports appx 12 episodes of vomiting. And a pressure in abd.

## 2016-10-04 NOTE — Discharge Instructions (Signed)
Return to the ER immediately if you develop worsening or uncontrolled pain, vomiting, fevers, or other new or concerning symptoms. Otherwise follow-up with your primary doctor today as scheduled and follow-up as soon as possible next week with a general surgeon for your gallbladder.

## 2016-10-04 NOTE — ED Provider Notes (Signed)
Patient's ultrasound shows a 6 mm gallbladder stone but no signs of cholecystitis. His pain is better at this time and he rates it as about a 4/10. It has been almost 2 hours since his last IV Dilaudid. I discussed the options with patient, he feels he gets pain is well controlled he feels comfortable going home. He has follow-up today with his PCP. He understands he needs a follow-up with a general surgeon as an outpatient for possible removal. Also discussed strict return precautions. Patient verbalized understanding and currently feels comfortable going home but will return if any symptoms were to worsen or new ones develop.   Results for orders placed or performed during the hospital encounter of 10/04/16  Lipase, blood  Result Value Ref Range   Lipase 26 11 - 51 U/L  Comprehensive metabolic panel  Result Value Ref Range   Sodium 138 135 - 145 mmol/L   Potassium 3.4 (L) 3.5 - 5.1 mmol/L   Chloride 104 101 - 111 mmol/L   CO2 22 22 - 32 mmol/L   Glucose, Bld 169 (H) 65 - 99 mg/dL   BUN 19 6 - 20 mg/dL   Creatinine, Ser 1.15 0.61 - 1.24 mg/dL   Calcium 9.0 8.9 - 10.3 mg/dL   Total Protein 7.4 6.5 - 8.1 g/dL   Albumin 4.3 3.5 - 5.0 g/dL   AST 23 15 - 41 U/L   ALT 28 17 - 63 U/L   Alkaline Phosphatase 69 38 - 126 U/L   Total Bilirubin 0.7 0.3 - 1.2 mg/dL   GFR calc non Af Amer >60 >60 mL/min   GFR calc Af Amer >60 >60 mL/min   Anion gap 12 5 - 15  CBC  Result Value Ref Range   WBC 13.2 (H) 4.0 - 10.5 K/uL   RBC 5.08 4.22 - 5.81 MIL/uL   Hemoglobin 16.1 13.0 - 17.0 g/dL   HCT 45.8 39.0 - 52.0 %   MCV 90.2 78.0 - 100.0 fL   MCH 31.7 26.0 - 34.0 pg   MCHC 35.2 30.0 - 36.0 g/dL   RDW 13.4 11.5 - 15.5 %   Platelets 288 150 - 400 K/uL  Urinalysis, Routine w reflex microscopic  Result Value Ref Range   Color, Urine YELLOW YELLOW   APPearance CLEAR CLEAR   Specific Gravity, Urine 1.016 1.005 - 1.030   pH 5.0 5.0 - 8.0   Glucose, UA NEGATIVE NEGATIVE mg/dL   Hgb urine dipstick  NEGATIVE NEGATIVE   Bilirubin Urine NEGATIVE NEGATIVE   Ketones, ur NEGATIVE NEGATIVE mg/dL   Protein, ur NEGATIVE NEGATIVE mg/dL   Nitrite NEGATIVE NEGATIVE   Leukocytes, UA NEGATIVE NEGATIVE   Ct Abdomen Pelvis Wo Contrast  Result Date: 10/04/2016 CLINICAL DATA:  Initial evaluation for acute abdominal pain, vomiting, abdominal distension. EXAM: CT ABDOMEN AND PELVIS WITHOUT CONTRAST TECHNIQUE: Multidetector CT imaging of the abdomen and pelvis was performed following the standard protocol without IV contrast. COMPARISON:  None. FINDINGS: Lower chest: Scattered bibasilar opacities within the visualized lung bases most consistent with atelectasis. Visualized lungs are otherwise clear. Hepatobiliary: Limited noncontrast evaluation liver unremarkable. Punctate calcification at the gallbladder neck suspicious for a small stone (series 4, image 72). Gallbladder itself without acute inflammatory changes. No biliary dilatation. Pancreas: Pancreas within normal limits. Spleen: Spleen within normal limits. Adrenals/Urinary Tract: Adrenal glands are normal. Kidneys equal in size without evidence for nephrolithiasis or hydronephrosis. 5.1 cm exophytic cyst seen at the upper pole the right kidney. No hydroureter. Bladder within  normal limits. Stomach/Bowel: Small hiatal hernia. Small diverticulum seen extending from the posterior aspect of the gastric fundus (series 2, image 20). No associated inflammation. Oral contrast with retained barium within the gastric lumen. No evidence for bowel obstruction. Appendix within normal limits. No acute inflammatory changes seen about the bowels. Vascular/Lymphatic: Mild air bi-iliac atherosclerotic disease. No aneurysm. No adenopathy. Reproductive: Prostate within normal limits. Other: No free air or fluid. Small fat containing paraumbilical hernia noted. Additional small bilateral fat containing inguinal hernias noted. Musculoskeletal: No acute osseus abnormality. No worrisome  lytic or blastic osseous lesions. IMPRESSION: 1. No CT evidence for acute intra-abdominal or pelvic process. 2. Punctate calcification in the gallbladder neck, suspicious for a small gallstone. No biliary dilatation or significant inflammatory changes about the gallbladder. Finding could be further assessed with dedicated right upper quadrant ultrasound as desired. 3. Small diverticulum at the gastric fundus without associated inflammation. 4. Bibasilar atelectasis. Electronically Signed   By: Jeannine Boga M.D.   On: 10/04/2016 06:26   US Abdomen Limited Ruq  Result Date: 10/04/2016 CLINICAL DATA:  Abdominal pain beginning this morning. Possible gallstone on CT EXAM: US ABDOMEN LIMITED - RIGHT UPPER QUADRANT COMPARISON:  CT 10/04/2016 FINDINGS: Gallbladder: Small gallstone noted within the gallbladder, 6 mm. Sludge also present within the gallbladder. No wall thickening or sonographic Murphy's sign. Common bile duct: Diameter: Normal caliber, 3 mm Liver: Increased echotexture compatible with fatty infiltration. No focal abnormality or biliary ductal dilatation. IMPRESSION: 6 mm gallstone and sludge within the gallbladder. No wall thickening or evidence of acute cholecystitis. Fatty liver. Electronically Signed   By: Rolm Baptise M.D.   On: 10/04/2016 07:42      Sherwood Gambler, MD 10/04/16 [phone]

## 2016-10-04 NOTE — ED Notes (Signed)
Patient is alert and oriented x3.  He was given DC instructions and follow up visit instructions.  Patient gave verbal understanding.  He was DC ambulatory under his own power to home.  V/S stable.  He was not showing any signs of distress on DC 

## 2016-10-04 NOTE — ED Notes (Signed)
Pt. Made aware for the need of urine specimen. 

## 2016-10-05 DIAGNOSIS — R1084 Generalized abdominal pain: Secondary | ICD-10-CM | POA: Diagnosis not present

## 2016-10-05 DIAGNOSIS — N3090 Cystitis, unspecified without hematuria: Secondary | ICD-10-CM | POA: Diagnosis not present

## 2016-10-05 DIAGNOSIS — R109 Unspecified abdominal pain: Secondary | ICD-10-CM | POA: Diagnosis not present

## 2016-10-08 DIAGNOSIS — K829 Disease of gallbladder, unspecified: Secondary | ICD-10-CM | POA: Diagnosis not present

## 2016-10-08 DIAGNOSIS — K648 Other hemorrhoids: Secondary | ICD-10-CM | POA: Diagnosis not present

## 2016-10-29 DIAGNOSIS — I10 Essential (primary) hypertension: Secondary | ICD-10-CM | POA: Diagnosis not present

## 2016-11-19 DIAGNOSIS — H401112 Primary open-angle glaucoma, right eye, moderate stage: Secondary | ICD-10-CM | POA: Diagnosis not present

## 2016-12-25 DIAGNOSIS — I10 Essential (primary) hypertension: Secondary | ICD-10-CM | POA: Diagnosis not present

## 2017-01-01 DIAGNOSIS — I10 Essential (primary) hypertension: Secondary | ICD-10-CM | POA: Diagnosis not present

## 2017-01-01 DIAGNOSIS — L03119 Cellulitis of unspecified part of limb: Secondary | ICD-10-CM | POA: Diagnosis not present

## 2017-03-18 IMAGING — CR DG CHEST 2V
2 series · 2 of 2 positions shown · non-contrast
Comparison: None.

CLINICAL DATA: Right-sided chest pain radiating to the back and
left shoulder since 9633 hours last night. Nonsmoker. Hypertension.

EXAM:
CHEST  2 VIEW

[w chest pa]
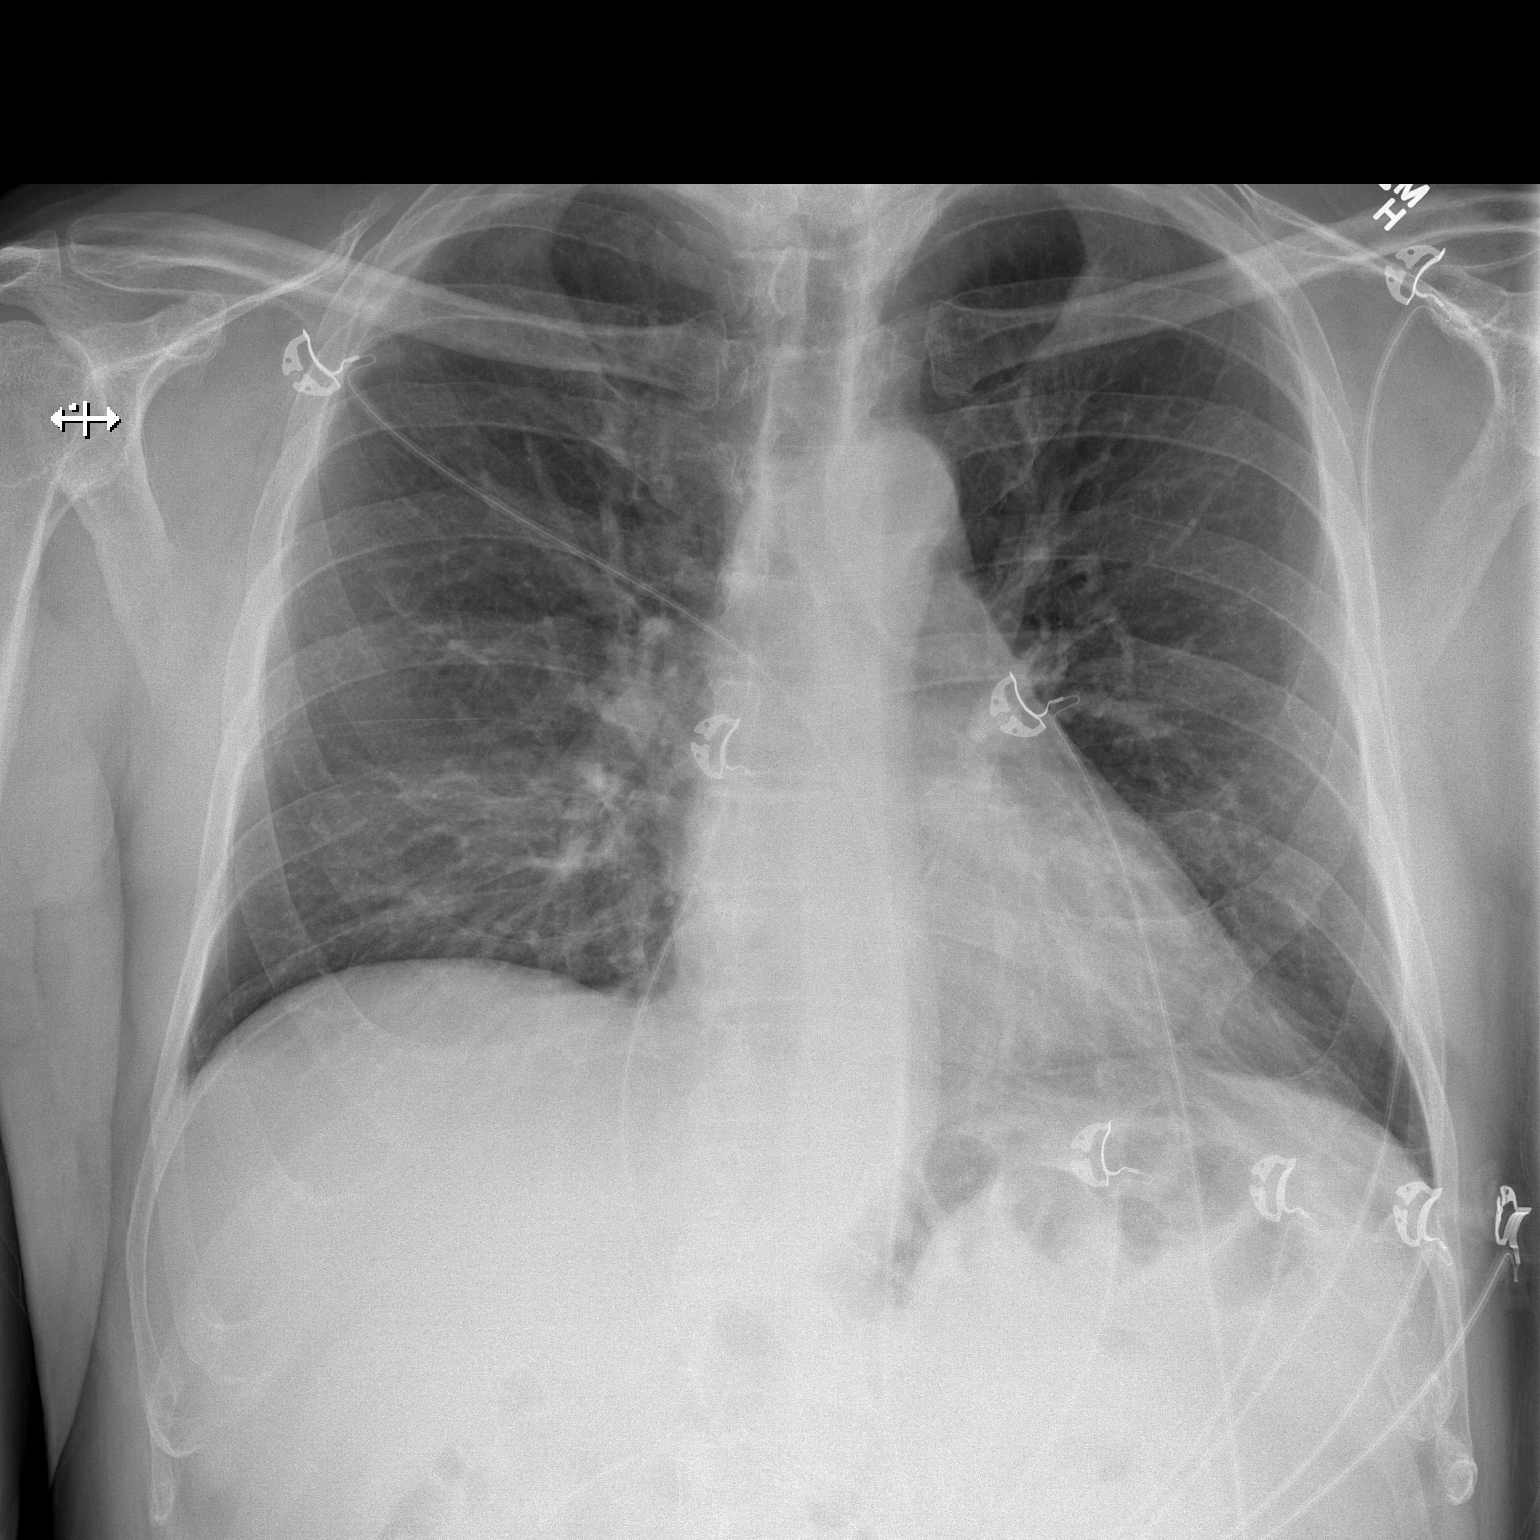

[w chest lat]
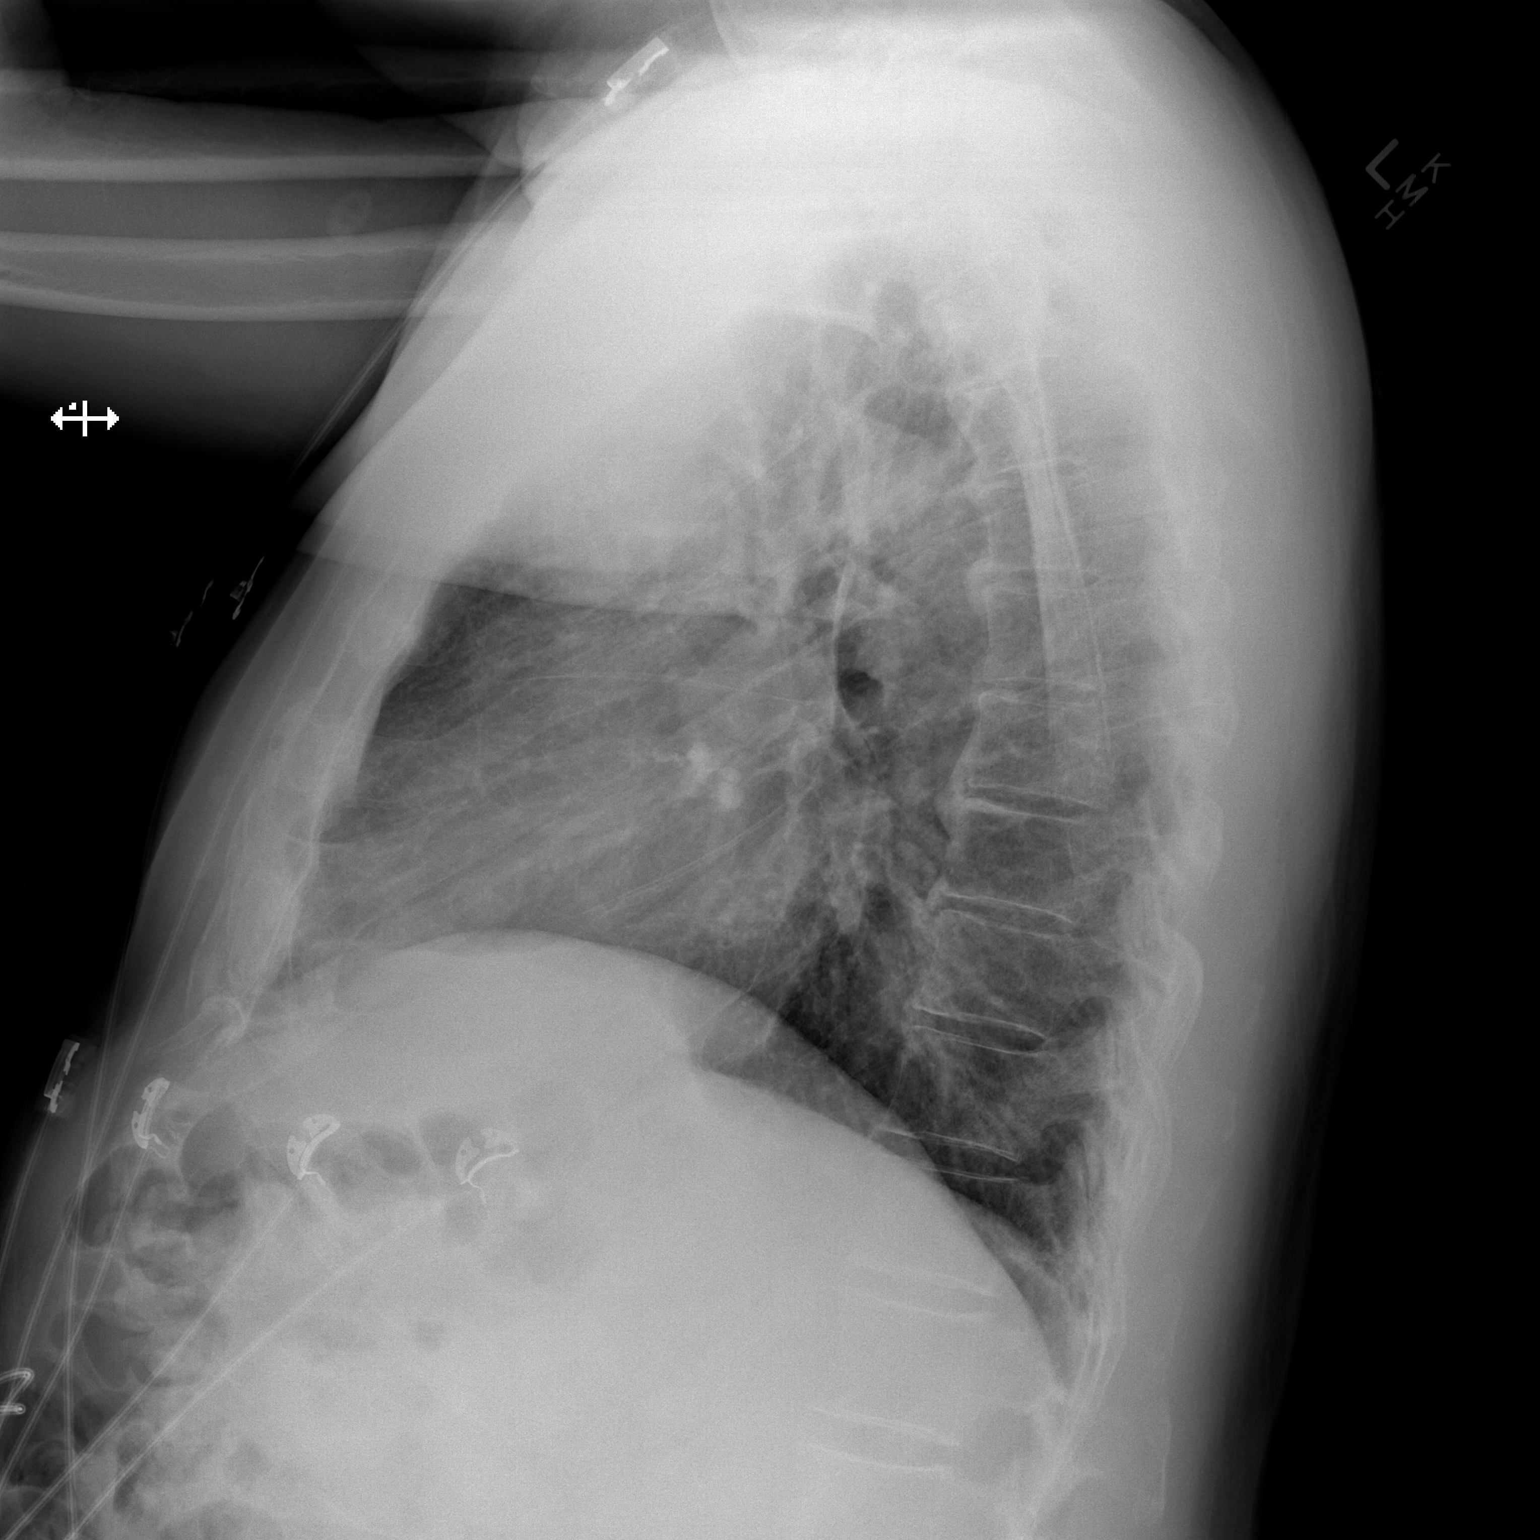

[2 of 2 positions shown; findings below may reference images not displayed]

FINDINGS: Normal heart size and pulmonary vascularity. No focal airspace
disease or consolidation in the lungs. No blunting of costophrenic
angles. No pneumothorax. Mediastinal contours appear intact.
Degenerative changes in the spine.
IMPRESSION: No active cardiopulmonary disease.

## 2017-05-19 DIAGNOSIS — H401112 Primary open-angle glaucoma, right eye, moderate stage: Secondary | ICD-10-CM | POA: Diagnosis not present

## 2017-06-25 DIAGNOSIS — Z1211 Encounter for screening for malignant neoplasm of colon: Secondary | ICD-10-CM | POA: Diagnosis not present

## 2017-06-25 DIAGNOSIS — Z23 Encounter for immunization: Secondary | ICD-10-CM | POA: Diagnosis not present

## 2017-06-25 DIAGNOSIS — H9190 Unspecified hearing loss, unspecified ear: Secondary | ICD-10-CM | POA: Diagnosis not present

## 2017-06-25 DIAGNOSIS — I10 Essential (primary) hypertension: Secondary | ICD-10-CM | POA: Diagnosis not present

## 2017-08-07 DIAGNOSIS — K648 Other hemorrhoids: Secondary | ICD-10-CM | POA: Diagnosis not present

## 2017-08-07 DIAGNOSIS — K635 Polyp of colon: Secondary | ICD-10-CM | POA: Diagnosis not present

## 2017-08-07 DIAGNOSIS — D126 Benign neoplasm of colon, unspecified: Secondary | ICD-10-CM | POA: Diagnosis not present

## 2017-08-07 DIAGNOSIS — Z1211 Encounter for screening for malignant neoplasm of colon: Secondary | ICD-10-CM | POA: Diagnosis not present

## 2017-08-12 DIAGNOSIS — Z1211 Encounter for screening for malignant neoplasm of colon: Secondary | ICD-10-CM | POA: Diagnosis not present

## 2017-08-12 DIAGNOSIS — K635 Polyp of colon: Secondary | ICD-10-CM | POA: Diagnosis not present

## 2017-08-12 DIAGNOSIS — D126 Benign neoplasm of colon, unspecified: Secondary | ICD-10-CM | POA: Diagnosis not present

## 2017-09-23 DIAGNOSIS — H401112 Primary open-angle glaucoma, right eye, moderate stage: Secondary | ICD-10-CM | POA: Diagnosis not present

## 2017-09-23 DIAGNOSIS — H5212 Myopia, left eye: Secondary | ICD-10-CM | POA: Diagnosis not present

## 2017-09-25 DIAGNOSIS — H40059 Ocular hypertension, unspecified eye: Secondary | ICD-10-CM | POA: Diagnosis not present

## 2017-09-25 DIAGNOSIS — G4700 Insomnia, unspecified: Secondary | ICD-10-CM | POA: Diagnosis not present

## 2017-09-25 DIAGNOSIS — Z841 Family history of disorders of kidney and ureter: Secondary | ICD-10-CM | POA: Diagnosis not present

## 2017-09-25 DIAGNOSIS — Z8249 Family history of ischemic heart disease and other diseases of the circulatory system: Secondary | ICD-10-CM | POA: Diagnosis not present

## 2017-09-25 DIAGNOSIS — I10 Essential (primary) hypertension: Secondary | ICD-10-CM | POA: Diagnosis not present

## 2017-09-25 DIAGNOSIS — Z791 Long term (current) use of non-steroidal anti-inflammatories (NSAID): Secondary | ICD-10-CM | POA: Diagnosis not present

## 2017-09-25 DIAGNOSIS — R52 Pain, unspecified: Secondary | ICD-10-CM | POA: Diagnosis not present

## 2017-09-30 DIAGNOSIS — L820 Inflamed seborrheic keratosis: Secondary | ICD-10-CM | POA: Diagnosis not present

## 2018-01-01 DIAGNOSIS — Z86010 Personal history of colonic polyps: Secondary | ICD-10-CM | POA: Diagnosis not present

## 2018-01-01 DIAGNOSIS — Z125 Encounter for screening for malignant neoplasm of prostate: Secondary | ICD-10-CM | POA: Diagnosis not present

## 2018-01-01 DIAGNOSIS — Z13220 Encounter for screening for lipoid disorders: Secondary | ICD-10-CM | POA: Diagnosis not present

## 2018-01-01 DIAGNOSIS — Z23 Encounter for immunization: Secondary | ICD-10-CM | POA: Diagnosis not present

## 2018-01-01 DIAGNOSIS — I10 Essential (primary) hypertension: Secondary | ICD-10-CM | POA: Diagnosis not present

## 2018-03-03 DIAGNOSIS — H401112 Primary open-angle glaucoma, right eye, moderate stage: Secondary | ICD-10-CM | POA: Diagnosis not present

## 2018-03-03 DIAGNOSIS — R972 Elevated prostate specific antigen [PSA]: Secondary | ICD-10-CM | POA: Diagnosis not present

## 2018-03-03 DIAGNOSIS — M545 Low back pain: Secondary | ICD-10-CM | POA: Diagnosis not present

## 2018-03-03 DIAGNOSIS — I10 Essential (primary) hypertension: Secondary | ICD-10-CM | POA: Diagnosis not present

## 2018-03-09 DIAGNOSIS — R972 Elevated prostate specific antigen [PSA]: Secondary | ICD-10-CM | POA: Diagnosis not present

## 2018-04-16 DIAGNOSIS — E782 Mixed hyperlipidemia: Secondary | ICD-10-CM | POA: Diagnosis not present

## 2018-04-20 DIAGNOSIS — R972 Elevated prostate specific antigen [PSA]: Secondary | ICD-10-CM | POA: Diagnosis not present

## 2018-04-28 DIAGNOSIS — C61 Malignant neoplasm of prostate: Secondary | ICD-10-CM | POA: Diagnosis not present

## 2018-04-29 ENCOUNTER — Other Ambulatory Visit (HOSPITAL_COMMUNITY): Payer: Self-pay | Admitting: Urology

## 2018-04-29 DIAGNOSIS — C61 Malignant neoplasm of prostate: Secondary | ICD-10-CM

## 2018-05-10 DIAGNOSIS — C61 Malignant neoplasm of prostate: Secondary | ICD-10-CM | POA: Insufficient documentation

## 2018-05-11 ENCOUNTER — Encounter: Payer: Self-pay | Admitting: Radiation Oncology

## 2018-05-11 ENCOUNTER — Encounter (HOSPITAL_COMMUNITY)
Admission: RE | Admit: 2018-05-11 | Discharge: 2018-05-11 | Disposition: A | Discharge status: Home / Self Care | Payer: Medicare HMO | Source: Ambulatory Visit | Attending: Urology | Admitting: Urology

## 2018-05-11 DIAGNOSIS — C61 Malignant neoplasm of prostate: Secondary | ICD-10-CM | POA: Diagnosis not present

## 2018-05-11 MED ORDER — TECHNETIUM TC 99M MEDRONATE IV KIT
19.6000 | PACK | Freq: Once | INTRAVENOUS | Status: AC | PRN
  Administered 2018-05-11: 19.6 via INTRAVENOUS

## 2018-05-11 NOTE — Progress Notes (Signed)
GU Location of Tumor / Histology: prostatic adenocarcinoma  If Prostate Cancer, Gleason Score is (3 + 4) and PSA is (9.14). Prostate volume: 40 cc.  Shane Myers was noted to have an elevated PSA of 9.75 by his PCP, Dr. Marisue Humble. A repeat PSA on 03/03/2018 remained elevated at 9.14. Accordingly, he was referred to Dr. Lovena Neighbours for further evaluation of an elevated PSA.   Biopsies of prostate (if applicable) revealed:    Past/Anticipated interventions by urology, if any: DRE, repeat PSA, prostate biopsy, CT abd/pelvis (negative), bone scan (MRI recommended), referral for consideration of radiotherapy.   Past/Anticipated interventions by medical oncology, if any: no  Weight changes, if any: no  Bowel/Bladder complaints, if any: Describes a full steady stream without difficulty emptying his bladder. Reports occasional urgency and frequency. Reports nocturia x 1. Denies dysuria, hematuria, urinary leakage or incontinence. Reports occasional urinary urgency but does "take diuretic."   Nausea/Vomiting, if any: no  Pain issues, if any:  Occasional left shoulder pain and hip bone pain that "doesn't linger." Denies back pain. Reports after he does his morning stretching routine this tends to resolve.   SAFETY ISSUES:  Prior radiation? no  Pacemaker/ICD? no  Possible current pregnancy? no, male patient  Is the patient on methotrexate? no  Current Complaints / other details:  68 year old male. Single. Denies family hx of prostate ca. Reports his father lived to be over 26.

## 2018-05-12 ENCOUNTER — Encounter: Payer: Self-pay | Admitting: Medical Oncology

## 2018-05-12 ENCOUNTER — Ambulatory Visit
Admission: RE | Admit: 2018-05-12 | Discharge: 2018-05-12 | Disposition: A | Discharge status: Home / Self Care | Payer: Medicare HMO | Source: Ambulatory Visit | Attending: Radiation Oncology | Admitting: Radiation Oncology

## 2018-05-12 ENCOUNTER — Encounter: Payer: Self-pay | Admitting: Radiation Oncology

## 2018-05-12 ENCOUNTER — Other Ambulatory Visit: Payer: Self-pay

## 2018-05-12 DIAGNOSIS — C61 Malignant neoplasm of prostate: Secondary | ICD-10-CM | POA: Diagnosis not present

## 2018-05-12 DIAGNOSIS — Z79899 Other long term (current) drug therapy: Secondary | ICD-10-CM | POA: Insufficient documentation

## 2018-05-12 DIAGNOSIS — I10 Essential (primary) hypertension: Secondary | ICD-10-CM | POA: Diagnosis not present

## 2018-05-12 DIAGNOSIS — R972 Elevated prostate specific antigen [PSA]: Secondary | ICD-10-CM | POA: Diagnosis not present

## 2018-05-12 DIAGNOSIS — R937 Abnormal findings on diagnostic imaging of other parts of musculoskeletal system: Secondary | ICD-10-CM | POA: Diagnosis not present

## 2018-05-12 HISTORY — DX: Malignant neoplasm of prostate: C61

## 2018-05-12 HISTORY — DX: Unspecified osteoarthritis, unspecified site: M19.90

## 2018-05-12 NOTE — Progress Notes (Signed)
Introduced myself to Shane Myers and his significant other Shane Myers, as the prostate nurse navigator and my role. He is quite anxious about being diagnosed with prostate cancer. He states he does not know of any family history of prostate cancer so was shocked to be diagnosed. He has limited knowledge of treatments for prostate cancer. He is here to discuss his radiation options and has an appointment with Dr. Alinda Money to discuss surgery.  I gave him my business card and asked him to call me with questions or concerns.

## 2018-05-12 NOTE — Progress Notes (Signed)
I called Dr. Jackson Latino office- [facility] and left a message to inform him Dr. Tammi Klippel would like to get a CT of chest to further evaluated questionable metastatic disease on bone scan. Patient is scheduled for CT of abdomen 1/2 at [facility]. I asked if he would be willing to order so patient can have both scans on 1/2. I asked for a return call to verify. I also sent an in-basket message to Dr. Lovena Neighbours.

## 2018-05-12 NOTE — Progress Notes (Signed)
Radiation Oncology         [phone]) [phone] ________________________________  Initial outpatient Consultation  Name: Shane Myers MRN: [id number]  Date: 05/12/2018  DOB: 01-05-1951  CC:Gaynelle Arabian, MD  Davis Gourd*   REFERRING PHYSICIAN: Davis Gourd*  DIAGNOSIS: 67 y.o. gentleman with Stage T1c adenocarcinoma of the prostate with Gleason score of 3+4, and PSA of 9.14.    ICD-10-CM   1. Malignant neoplasm of prostate ([place]) [place] [facility] referral to Social Work    HISTORY OF PRESENT ILLNESS: Shane Myers is a 67 y.o. male with a diagnosis of prostate cancer. He was noted to have an elevated PSA of 9.75 by his primary care physician, Dr. Marisue Humble.  He states this was his first PSA reading.  A repeat PSA on 03/03/2018 remained elevated at 9.14.  Accordingly, he was referred for evaluation in urology by Dr. Lovena Neighbours on 03/09/2018,  digital rectal examination was performed at that time revealing symmetrical prostate lobes without discrete nodularity.  The patient proceeded to transrectal ultrasound with 12 biopsies of the prostate on 04/20/2018.  The prostate volume measured 40 cc.  Out of 12 core biopsies, 7 were positive.  The maximum Gleason score was 3+4, and this was seen in the left mid lateral and left apex.  Additionally, Gleason 3+3 disease was noted in the left base lateral, left apex lateral, right apex, right apex lateral and right mid lateral.  CT pelvis and bone scan imaging were ordered for disease staging.  A bone scan was performed on 05/11/2018 and reveals appearance suspicious for osseous metastatic disease in the thoracic spine at approximately T8, and in the left proximal humerus. The abdomen/pelvis CT is scheduled for 05/14/18.   The patient reviewed the biopsy results with his urologist and he has kindly been referred today for discussion of potential radiation treatment options. He is also scheduled to meet with Dr. Alinda Money on 05/20/18 to further discuss  surgical options.  PREVIOUS RADIATION THERAPY: No  PAST MEDICAL HISTORY:  Past Medical History:  Diagnosis Date  . Arthritis    mild osteoarthritis.  Marland Kitchen HTN (hypertension)   . Prostate cancer ([place])       PAST SURGICAL HISTORY: Past Surgical History:  Procedure Laterality Date  . PROSTATE BIOPSY    . TONSILLECTOMY    . WISDOM TOOTH EXTRACTION      FAMILY HISTORY:  Family History  Problem Relation Age of Onset  . Cancer Neg Hx   . Breast cancer Neg Hx   . Prostate cancer Neg Hx   . Colon cancer Neg Hx   . Pancreatic cancer Neg Hx     SOCIAL HISTORY:  Social History   Socioeconomic History  . Marital status: Significant Other    Spouse name: Ms. [name]  . Number of children: Not on file  . Years of education: Not on file  . Highest education level: Not on file  Occupational History  . 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 Financial resource strain: Not on file  . Food insecurity:    Worry: Not on file    Inability: Not on file  . Transportation needs:    Medical: Not on file    Non-medical: Not on file  Tobacco Use  . Smoking status: Never Smoker  . Smokeless tobacco: Never Used  Substance and Sexual Activity  . Alcohol use: Yes  . Drug use: No  . Sexual activity: Not Currently  Lifestyle  . Physical  activity:    Days per week: Not on file    Minutes per session: Not on file  . Stress: Not on file  Relationships  . Social connections:    Talks on phone: Not on file    Gets together: Not on file    Attends religious service: Not on file    Active member of club or organization: Not on file    Attends meetings of clubs or organizations: Not on file    Relationship status: Not on file  . Intimate partner violence:    Fear of current or ex partner: Not on file    Emotionally abused: Not on file    Physically abused: Not on file    Forced sexual activity: Not on file  Other Topics Concern  . Not on file  Social History Narrative    . Not on file    ALLERGIES: Ivp dye [iodinated diagnostic agents]  MEDICATIONS:  Current Outpatient Medications  Medication Sig Dispense Refill  . Melatonin 1 MG TABS Take by mouth.    . timolol (TIMOPTIC) 0.5 % ophthalmic solution Place 1 drop into the right eye at bedtime.   0  . triamterene-hydrochlorothiazide (DYAZIDE) 37.5-25 MG capsule Take 1 capsule by mouth daily.     No current facility-administered medications for this encounter.     REVIEW OF SYSTEMS:  On review of systems, the patient reports that he is doing well overall. He denies any chest pain, shortness of breath, cough, fevers, chills, night sweats, unintended weight changes. He denies any bowel disturbances, and denies abdominal pain, nausea or vomiting. He reports occasional left shoulder and left hip pain, usually when he first wakes up and resolves after he stretches in the morning. The pain does not wake him from sleep at night.  He denies any pain in the mid-lower back. His IPSS was 4, indicating mild urinary symptoms. He reports occasional urgency and frequency, and notes he takes a diuretic. His SHIM was 16, indicating he does have mild to moderate erectile dysfunction. A complete review of systems is obtained and is otherwise negative.    PHYSICAL EXAM:  Wt Readings from Last 3 Encounters:  05/12/18 210 lb 9.6 oz (95.5 kg)  10/04/16 220 lb (99.8 kg)  05/01/15 205 lb (93 kg)   Temp Readings from Last 3 Encounters:  05/12/18 98.1 F (36.7 C)  10/04/16 97.2 F (36.2 C) (Oral)  05/01/15 98.3 F (36.8 C) (Oral)   BP Readings from Last 3 Encounters:  05/12/18 (!) 144/91  10/04/16 (!) 143/73  05/01/15 163/85   Pulse Readings from Last 3 Encounters:  05/12/18 92  10/04/16 82  05/01/15 93   Pain Assessment Pain Score: 0-No pain/10  In general this is a well appearing Caucasian gentleman in no acute distress. He appears very nervous, affect is flat. HEENT reveals that the patient is normocephalic,  atraumatic. EOMs are intact. PERRLA. Skin is intact without any evidence of gross lesions. Cardiovascular exam reveals a regular rate and rhythm, no clicks rubs or murmurs are auscultated. Chest is clear to auscultation bilaterally. Lymphatic assessment is performed and does not reveal any adenopathy in the cervical, supraclavicular, axillary, or inguinal chains. Abdomen has active bowel sounds in all quadrants and is intact. The abdomen is soft, non tender, non distended. There is no pain with palpation over the thoracic spinous processes at the level of T6-T10. Lower extremities are negative for pretibial pitting edema, deep calf tenderness, cyanosis or clubbing.  KPS = 100  100 - Normal; no complaints; no evidence of disease. 90   - Able to carry on normal activity; minor signs or symptoms of disease. 80   - Normal activity with effort; some signs or symptoms of disease. 108   - Cares for self; unable to carry on normal activity or to do active work. 60   - Requires occasional assistance, but is able to care for most of his personal needs. 50   - Requires considerable assistance and frequent medical care. 37   - Disabled; requires special care and assistance. 25   - Severely disabled; hospital admission is indicated although death not imminent. 70   - Very sick; hospital admission necessary; active supportive treatment necessary. 10   - Moribund; fatal processes progressing rapidly. 0     - Dead  Karnofsky DA, Abelmann [place], Craver LS and Burchenal JH [phone]) The use of the nitrogen mustards in the palliative treatment of carcinoma: with particular reference to bronchogenic carcinoma Cancer 1 634-56  LABORATORY DATA:  Lab Results  Component Value Date   WBC 13.2 (H) 10/04/2016   HGB 16.1 10/04/2016   HCT 45.8 10/04/2016   MCV 90.2 10/04/2016   PLT 288 10/04/2016   Lab Results  Component Value Date   NA 138 10/04/2016   K 3.4 (L) 10/04/2016   CL 104 10/04/2016   CO2 22 10/04/2016    Lab Results  Component Value Date   ALT 28 10/04/2016   AST 23 10/04/2016   ALKPHOS 69 10/04/2016   BILITOT 0.7 10/04/2016     RADIOGRAPHY: Nm Bone Scan Whole Body  Result Date: 05/11/2018 CLINICAL DATA:  67 year old male with prostate cancer. Left rotator cuff pain. EXAM: NUCLEAR MEDICINE WHOLE BODY BONE SCAN TECHNIQUE: Whole body anterior and posterior images were obtained approximately 3 hours after intravenous injection of radiopharmaceutical. RADIOPHARMACEUTICALS:  19.6 mCi Technetium-42m MDP IV COMPARISON:  CT Abdomen and Pelvis 10/04/2016.  Chest CT 04/30/2015. FINDINGS: Expected radiotracer activity in both kidneys in the urinary bladder. Abnormal approximately T8 level vertebral and left side transverse process versus costovertebral junction moderate to intense radiotracer activity. This is not correlated with a vertebral lesion on the 2016 chest CT. And there is otherwise homogeneous radiotracer activity throughout the spine. Homogeneous radiotracer activity elsewhere in the axial skeleton including the skull and pelvis. There is indistinct and masslike area of asymmetric increased radiotracer activity in the left humeral head, also not correlated with an abnormality at the visible left shoulder structures in 2016. Mild degenerative appearing radiotracer activity at both knees and the right dorsal calcaneus. IMPRESSION: 1. Appearance suspicious for osseous metastatic disease in the thoracic spine at approximately T8, and in the left proximal humerus. MRI correlation at both areas (e.g. Left Shoulder and Thoracic Spine MRIs without and with contrast) would have the highest specificity. 2. No other suspicious radiotracer activity on whole-body bone scan. Electronically Signed   By: Genevie Ann M.D.   On: 05/11/2018 16:59      IMPRESSION/PLAN: 1. 67 y.o. gentleman with Stage T1c adenocarcinoma of the prostate with Gleason Score of 3+4, and PSA of 9.14. We discussed the patient's workup and  outlined the nature of prostate cancer in this setting. The patient's T stage, Gleason's score, and PSA put him into the favorable intermediate risk group. Accordingly, he is eligible for a variety of potential treatment options including brachytherapy, 5.5 weeks of external radiation or prostatectomy. We discussed the available radiation techniques, and focused on the details and logistics and delivery.  We discussed and outlined the risks, benefits, short and long-term effects associated with radiotherapy and compared and contrasted these with prostatectomy. We discussed the role of SpaceOAR in reducing the rectal toxicity associated with radiotherapy.   We discussed the abnormal findings on recent bone scan and the need for further imaging evaluation which could potentially impact/alter our final treatment recommendation. He is scheduled for abdomen/pelvis CT scan on 05/14/18 at [facility] and we will request that he also undergo CT Chest imaging at that time as well. He understands that our final recommendation for treatment will be based on findings on upcoming imaging to determine overall disease stage. He is scheduled to meet with Dr. Alinda Money on 05/20/18 to further discuss surgical treatment options.  We would be happy to continue to participate in the care of this nice gentleman should he ultimately elect to proceed with radiotherapy for treatment of his disease.  We spent 70 minutes face to face with the patient and more than 50% of that time was spent in counseling and/or coordination of care.    Nicholos Johns, PA-C    Tyler Pita, MD  [facility] Direct Dial: [phone]  Fax: [phone] [email or web address]  Skype  [email or web address]   This document serves as a record of services personally performed by Tyler Pita, MD and Freeman Caldron, PA-C. It was created on their behalf by Wilburn Mylar, a trained medical scribe. The creation of this record is based on  the scribe's personal observations and the provider's statements to them. This document has been checked and approved by the attending provider.

## 2018-05-12 NOTE — Progress Notes (Signed)
Received a call  From Thayer Headings with  Dr. Jackson Latino office with CT update. Dr. Lovena Neighbours has placed order for CT of chest and insurance company asked for supporting documents. The bone scan report has been submitted. Insurance company said it could be a 4 hour turn around time with ? Approval. Thayer Headings will call before 5 pm today and if no answer will follow up first thing Thursday morning. Tomorrow is a holiday. Ashlyn/Dr. Tammi Klippel notified.

## 2018-05-12 NOTE — Progress Notes (Signed)
See progress note under physician encounter. 

## 2018-05-13 DIAGNOSIS — C61 Malignant neoplasm of prostate: Secondary | ICD-10-CM

## 2018-05-13 HISTORY — DX: Malignant neoplasm of prostate: C61

## 2018-05-14 DIAGNOSIS — C61 Malignant neoplasm of prostate: Secondary | ICD-10-CM | POA: Diagnosis not present

## 2018-05-14 DIAGNOSIS — K449 Diaphragmatic hernia without obstruction or gangrene: Secondary | ICD-10-CM | POA: Diagnosis not present

## 2018-05-14 DIAGNOSIS — K8020 Calculus of gallbladder without cholecystitis without obstruction: Secondary | ICD-10-CM | POA: Diagnosis not present

## 2018-05-18 ENCOUNTER — Other Ambulatory Visit (HOSPITAL_COMMUNITY): Payer: Self-pay | Admitting: Urology

## 2018-05-18 ENCOUNTER — Other Ambulatory Visit: Payer: Self-pay | Admitting: Urology

## 2018-05-18 DIAGNOSIS — C61 Malignant neoplasm of prostate: Secondary | ICD-10-CM

## 2018-05-18 DIAGNOSIS — C7951 Secondary malignant neoplasm of bone: Secondary | ICD-10-CM

## 2018-05-19 DIAGNOSIS — C61 Malignant neoplasm of prostate: Secondary | ICD-10-CM | POA: Diagnosis not present

## 2018-05-21 ENCOUNTER — Encounter: Payer: Self-pay | Admitting: General Practice

## 2018-05-21 NOTE — Progress Notes (Signed)
[facility] Psychosocial Distress Screening Clinical Social Work  Clinical Social Work was referred by distress screening protocol.  The patient scored a 7 on the Psychosocial Distress Thermometer which indicates moderate distress. Clinical Social Worker contacted patient by phone to assess for distress and other psychosocial needs. "I do have a lot of distress, I am feeling pretty fatalistic, not sure my outcomes are going to be very good."  I s having difficulty sleeping - falls asleep but awakens and is unable to get back to sleep.  Has retired, but finds activities to distract during the day.  Concerned about upcoming bone biopsy/results.  Has connected w network of supportive friends, some of whom are prostate cancer survivors as well.  Encouraged him to discuss sleeping difficulties w PCP, consider asking for medication to help w sleep. This has been recommended by others as well, states he will contact PCP. CSW will mail information packet on [place], encouraged patient to access services as needed.     ONCBCN DISTRESS SCREENING 05/12/2018  Screening Type Initial Screening  Distress experienced in past week (1-10) 7  Emotional problem type Nervousness/Anxiety;Adjusting to illness;Feeling hopeless  Spiritual/Religous concerns type Facing my mortality  Physical Problem type Sleep/insomnia;Loss of appetitie  Physician notified of physical symptoms Yes  Referral to clinical psychology No  Referral to clinical social work Yes  Referral to dietition No  Referral to financial advocate No  Referral to support programs No  Referral to palliative care No    Clinical Social Worker follow up needed: No.  If yes, follow up plan:  Beverely Pace, [place], LCSW Clinical Social Worker Phone:  [phone]

## 2018-05-26 DIAGNOSIS — C61 Malignant neoplasm of prostate: Secondary | ICD-10-CM | POA: Diagnosis not present

## 2018-05-26 DIAGNOSIS — R69 Illness, unspecified: Secondary | ICD-10-CM | POA: Diagnosis not present

## 2018-05-27 ENCOUNTER — Other Ambulatory Visit: Payer: Self-pay | Admitting: Radiology

## 2018-05-28 ENCOUNTER — Other Ambulatory Visit (HOSPITAL_COMMUNITY): Payer: Self-pay | Admitting: Urology

## 2018-05-28 ENCOUNTER — Ambulatory Visit (HOSPITAL_COMMUNITY)
Admission: RE | Admit: 2018-05-28 | Discharge: 2018-05-28 | Disposition: A | Discharge status: Home / Self Care | Payer: Medicare HMO | Source: Ambulatory Visit

## 2018-05-28 ENCOUNTER — Ambulatory Visit (HOSPITAL_COMMUNITY)
Admission: RE | Admit: 2018-05-28 | Discharge: 2018-05-28 | Disposition: A | Discharge status: Home / Self Care | Payer: Medicare HMO | Source: Ambulatory Visit | Attending: Urology | Admitting: Urology

## 2018-05-28 ENCOUNTER — Encounter (HOSPITAL_COMMUNITY): Payer: Self-pay

## 2018-05-28 DIAGNOSIS — C7951 Secondary malignant neoplasm of bone: Secondary | ICD-10-CM | POA: Insufficient documentation

## 2018-05-28 DIAGNOSIS — C61 Malignant neoplasm of prostate: Secondary | ICD-10-CM

## 2018-05-28 DIAGNOSIS — Z91041 Radiographic dye allergy status: Secondary | ICD-10-CM | POA: Insufficient documentation

## 2018-05-28 DIAGNOSIS — I10 Essential (primary) hypertension: Secondary | ICD-10-CM | POA: Diagnosis not present

## 2018-05-28 DIAGNOSIS — Z79899 Other long term (current) drug therapy: Secondary | ICD-10-CM | POA: Insufficient documentation

## 2018-05-28 DIAGNOSIS — M1990 Unspecified osteoarthritis, unspecified site: Secondary | ICD-10-CM | POA: Diagnosis not present

## 2018-05-28 DIAGNOSIS — M898X8 Other specified disorders of bone, other site: Secondary | ICD-10-CM | POA: Diagnosis not present

## 2018-05-28 DIAGNOSIS — Z888 Allergy status to other drugs, medicaments and biological substances status: Secondary | ICD-10-CM | POA: Insufficient documentation

## 2018-05-28 DIAGNOSIS — M899 Disorder of bone, unspecified: Secondary | ICD-10-CM | POA: Insufficient documentation

## 2018-05-28 HISTORY — PX: IR FLUORO GUIDED NEEDLE PLC ASPIRATION/INJECTION LOC: IMG2395

## 2018-05-28 LAB — CBC
HCT: 49.8 % (ref 39.0–52.0)
Hemoglobin: 17 g/dL (ref 13.0–17.0)
MCH: 31.1 pg (ref 26.0–34.0)
MCHC: 34.1 g/dL (ref 30.0–36.0)
MCV: 91 fL (ref 80.0–100.0)
PLATELETS: 260 10*3/uL (ref 150–400)
RBC: 5.47 MIL/uL (ref 4.22–5.81)
RDW: 12.8 % (ref 11.5–15.5)
WBC: 6.7 10*3/uL (ref 4.0–10.5)
nRBC: 0 % (ref 0.0–0.2)

## 2018-05-28 LAB — APTT: aPTT: 36 seconds (ref 24–36)

## 2018-05-28 LAB — PROTIME-INR
INR: 0.87
PROTHROMBIN TIME: 11.8 s (ref 11.4–15.2)

## 2018-05-28 MED ORDER — NALOXONE HCL 0.4 MG/ML IJ SOLN
INTRAMUSCULAR | Status: AC
  Filled 2018-05-28: qty 1

## 2018-05-28 MED ORDER — MIDAZOLAM HCL 2 MG/2ML IJ SOLN
INTRAMUSCULAR | Status: AC | PRN
  Administered 2018-05-28 (×4): 1 mg via INTRAVENOUS

## 2018-05-28 MED ORDER — LIDOCAINE HCL (PF) 1 % IJ SOLN
INTRAMUSCULAR | Status: AC
  Filled 2018-05-28: qty 30

## 2018-05-28 MED ORDER — LIDOCAINE HCL (PF) 1 % IJ SOLN
INTRAMUSCULAR | Status: AC | PRN
  Administered 2018-05-28: 10 mL via INTRADERMAL

## 2018-05-28 MED ORDER — FENTANYL CITRATE (PF) 100 MCG/2ML IJ SOLN
INTRAMUSCULAR | Status: AC | PRN
  Administered 2018-05-28 (×2): 50 ug via INTRAVENOUS

## 2018-05-28 MED ORDER — FLUMAZENIL 0.5 MG/5ML IV SOLN
INTRAVENOUS | Status: AC
  Filled 2018-05-28: qty 5

## 2018-05-28 MED ORDER — MIDAZOLAM HCL 2 MG/2ML IJ SOLN
INTRAMUSCULAR | Status: AC
  Filled 2018-05-28: qty 4

## 2018-05-28 MED ORDER — SODIUM CHLORIDE 0.9 % IV SOLN
INTRAVENOUS | Status: DC
  Administered 2018-05-28: 10:00:00 via INTRAVENOUS

## 2018-05-28 MED ORDER — HYDROCODONE-ACETAMINOPHEN 5-325 MG PO TABS: 1.0000 | ORAL_TABLET | ORAL | Status: DC | PRN

## 2018-05-28 MED ORDER — FENTANYL CITRATE (PF) 100 MCG/2ML IJ SOLN
INTRAMUSCULAR | Status: AC
  Filled 2018-05-28: qty 4

## 2018-05-28 NOTE — Procedures (Signed)
  Procedure: T7 vert body core biopsy under fluoro   EBL:   minimal Complications:  none immediate  See full dictation in [facility].  Dillard Cannon MD Main # [phone] Pager  [phone]

## 2018-05-28 NOTE — Discharge Instructions (Addendum)
You may remove your dressing and shower tomorrow.  Needle Biopsy of the Bone, Care After Refer to this sheet in the next few weeks. These instructions provide you with information about caring for yourself after your procedure. Your health care provider may also give you more specific instructions. Your treatment has been planned according to current medical practices, but problems sometimes occur. Call your health care provider if you have any problems or questions after your procedure. What can I expect after the procedure? After your procedure, it is common to have soreness or tenderness at the puncture site. Follow these instructions at home:  Take over-the-counter and prescription medicines only as told by your health care provider.  Bathe and shower as told by your health care provider.  Follow instructions from your health care provider about: ? How to take care of your puncture site. ? When and how you should change your bandage (dressing). ? When you should remove your dressing.  Check your puncture site every day for signs of infection. Watch for: ? Redness, swelling, or worsening pain. ? Fluid, blood, or pus.  Return to your normal activities as told by your health care provider.  Keep all follow-up visits as told by your health care provider. This is important. Contact a health care provider if:  You have redness, swelling, or worsening pain at the site of your puncture.  You have fluid, blood, or pus coming from your puncture site.  You have a fever.  You have persistent nausea or vomiting. Get help right away if:  You develop a rash.  You have difficulty breathing. This information is not intended to replace advice given to you by your health care provider. Make sure you discuss any questions you have with your health care provider. Document Released: 11/16/2004 Document Revised: 10/05/2015 Document Reviewed: 06/06/2014 Elsevier Interactive Patient Education   2019 [place].   Moderate Conscious Sedation, Adult, Care After These instructions provide you with information about caring for yourself after your procedure. Your health care provider may also give you more specific instructions. Your treatment has been planned according to current medical practices, but problems sometimes occur. Call your health care provider if you have any problems or questions after your procedure. What can I expect after the procedure? After your procedure, it is common:  To feel sleepy for several hours.  To feel clumsy and have poor balance for several hours.  To have poor judgment for several hours.  To vomit if you eat too soon. Follow these instructions at home: For at least 24 hours after the procedure:   Do not: ? Participate in activities where you could fall or become injured. ? Drive. ? Use heavy machinery. ? Drink alcohol. ? Take sleeping pills or medicines that cause drowsiness. ? Make important decisions or sign legal documents. ? Take care of children on your own.  Rest. Eating and drinking  Follow the diet recommended by your health care provider.  If you vomit: ? Drink water, juice, or soup when you can drink without vomiting. ? Make sure you have little or no nausea before eating solid foods. General instructions  Have a responsible adult stay with you until you are awake and alert.  Take over-the-counter and prescription medicines only as told by your health care provider.  If you smoke, do not smoke without supervision.  Keep all follow-up visits as told by your health care provider. This is important. Contact a health care provider if:  You keep  feeling nauseous or you keep vomiting.  You feel light-headed.  You develop a rash.  You have a fever. Get help right away if:  You have trouble breathing. This information is not intended to replace advice given to you by your health care provider. Make sure you discuss  any questions you have with your health care provider. Document Released: 02/17/2013 Document Revised: 10/02/2015 Document Reviewed: 08/19/2015 Elsevier Interactive Patient Education  2019 [employer].

## 2018-05-28 NOTE — Consult Note (Signed)
Chief Complaint: Patient was seen in consultation today for image guided biopsy of thoracic vertebral body lesion  Referring Physician(s): Winter,Christopher Marjory Lies  Supervising Physician: Arne Cleveland  Patient Status: [facility] - Out-pt  History of Present Illness: Shane Myers is a 68 y.o. male with history of hypertension and recently diagnosed prostate carcinoma.  He has had intermittent back/left shoulder pain and recent bone scan has revealed abnormal activity in thoracic spine and left proximal humerus.  He presents today for image guided biopsy of a thoracic spine lesion for further evaluation.  Past Medical History:  Diagnosis Date  . Arthritis    mild osteoarthritis.  Marland Kitchen HTN (hypertension)   . Prostate cancer [facility])     Past Surgical History:  Procedure Laterality Date  . PROSTATE BIOPSY    . TONSILLECTOMY    . WISDOM TOOTH EXTRACTION      Allergies: Ivp dye [iodinated diagnostic agents]; Amlodipine; and Lisinopril  Medications: Prior to Admission medications   Medication Sig Start Date End Date Taking? Authorizing Provider  ALPRAZolam Duanne Moron) 0.5 MG tablet Take 0.5 mg by mouth at bedtime as needed for anxiety.   Yes [provider]  ibuprofen (ADVIL,MOTRIN) 200 MG tablet Take 200 mg by mouth every 6 (six) hours as needed.   Yes [provider]  Melatonin 1 MG TABS Take by mouth.   Yes [provider]  timolol (TIMOPTIC) 0.5 % ophthalmic solution Place 1 drop into the right eye at bedtime.    Yes [provider]  triamterene-hydrochlorothiazide (DYAZIDE) 37.5-25 MG capsule Take 1 capsule by mouth daily.   Yes [provider]     Family History  Problem Relation Age of Onset  . Cancer Neg Hx   . Breast cancer Neg Hx   . Prostate cancer Neg Hx   . Colon cancer Neg Hx   . Pancreatic cancer Neg Hx     Social History   Socioeconomic History  . Marital status: Significant Other    Spouse name: Shane Myers  .  Number of children: Not on file  . Years of education: Not on file  . Highest education level: Not on file  Occupational History  . 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 Financial resource strain: Not on file  . Food insecurity:    Worry: Not on file    Inability: Not on file  . Transportation needs:    Medical: Not on file    Non-medical: Not on file  Tobacco Use  . Smoking status: Never Smoker  . Smokeless tobacco: Never Used  Substance and Sexual Activity  . Alcohol use: Yes  . Drug use: No  . Sexual activity: Not Currently  Lifestyle  . Physical activity:    Days per week: Not on file    Minutes per session: Not on file  . Stress: Not on file  Relationships  . Social connections:    Talks on phone: Not on file    Gets together: Not on file    Attends religious service: Not on file    Active member of club or organization: Not on file    Attends meetings of clubs or organizations: Not on file    Relationship status: Not on file  Other Topics Concern  . Not on file  Social History Narrative  . Not on file      Review of Systems see above; denies fever, headache, chest pain, dyspnea, cough, abdominal pain, nausea, vomiting  or bleeding  Vital Signs: BP (!) 149/84 (BP Location: Right Arm)   Pulse 85   Temp 97.8 F (36.6 C) (Oral)   Resp 18   SpO2 98%   Physical Exam awake, alert. Flat affect.  Chest clear to auscultation bilaterally.  Heart with regular rate and rhythm.  Abdomen soft, positive bowel sounds, nontender.  No lower extremity edema.  Imaging: Nm Bone Scan Whole Body  Result Date: 05/11/2018 CLINICAL DATA:  68 year old male with prostate cancer. Left rotator cuff pain. EXAM: NUCLEAR MEDICINE WHOLE BODY BONE SCAN TECHNIQUE: Whole body anterior and posterior images were obtained approximately 3 hours after intravenous injection of radiopharmaceutical. RADIOPHARMACEUTICALS:  19.6 mCi Technetium-40m MDP IV COMPARISON:  CT Abdomen and  Pelvis 10/04/2016.  Chest CT 04/30/2015. FINDINGS: Expected radiotracer activity in both kidneys in the urinary bladder. Abnormal approximately T8 level vertebral and left side transverse process versus costovertebral junction moderate to intense radiotracer activity. This is not correlated with a vertebral lesion on the 2016 chest CT. And there is otherwise homogeneous radiotracer activity throughout the spine. Homogeneous radiotracer activity elsewhere in the axial skeleton including the skull and pelvis. There is indistinct and masslike area of asymmetric increased radiotracer activity in the left humeral head, also not correlated with an abnormality at the visible left shoulder structures in 2016. Mild degenerative appearing radiotracer activity at both knees and the right dorsal calcaneus. IMPRESSION: 1. Appearance suspicious for osseous metastatic disease in the thoracic spine at approximately T8, and in the left proximal humerus. MRI correlation at both areas (e.g. Left Shoulder and Thoracic Spine MRIs without and with contrast) would have the highest specificity. 2. No other suspicious radiotracer activity on whole-body bone scan. Electronically Signed   By: Genevie Ann M.D.   On: 05/11/2018 16:59    Labs:  CBC: Recent Labs    05/28/18 0935  WBC 6.7  HGB 17.0  HCT 49.8  PLT 260    COAGS: No results for input(s): INR, APTT in the last 8760 hours.  BMP: No results for input(s): NA, K, CL, CO2, GLUCOSE, BUN, CALCIUM, CREATININE, GFRNONAA, GFRAA in the last 8760 hours.  Invalid input(s): CMP  LIVER FUNCTION TESTS: No results for input(s): BILITOT, AST, ALT, ALKPHOS, PROT, ALBUMIN in the last 8760 hours.  TUMOR MARKERS: No results for input(s): AFPTM, CEA, CA199, CHROMGRNA in the last 8760 hours.  Assessment and Plan: 68 y.o. male with history of hypertension and recently diagnosed prostate carcinoma.  He has had intermittent back/left shoulder pain and recent bone scan has revealed  abnormal activity in thoracic spine and left proximal humerus.  He presents today for image guided biopsy of a thoracic spine lesion for further evaluation.Risks and benefits discussed with the patient including, but not limited to bleeding, infection, damage to adjacent structures or low yield requiring additional tests.  All of the patient's questions were answered, patient is agreeable to proceed. Consent signed and in chart.     Thank you for this interesting consult.  I greatly enjoyed meeting TREVIAN HAYASHIDA and look forward to participating in their care.  A copy of this report was sent to the requesting provider on this date.  Electronically Signed: D. Rowe Robert, PA-C 05/28/2018, 9:53 AM    I spent a total of 25 minutes  in face to face in clinical consultation, greater than 50% of which was counseling/coordinating care for image guided biopsy of thoracic spine lesion

## 2018-06-03 ENCOUNTER — Ambulatory Visit (HOSPITAL_COMMUNITY)
Admission: RE | Admit: 2018-06-03 | Discharge: 2018-06-03 | Disposition: A | Discharge status: Home / Self Care | Payer: Medicare HMO | Source: Ambulatory Visit | Attending: Urology | Admitting: Urology

## 2018-06-03 ENCOUNTER — Other Ambulatory Visit (HOSPITAL_COMMUNITY): Payer: Self-pay | Admitting: Urology

## 2018-06-03 DIAGNOSIS — C799 Secondary malignant neoplasm of unspecified site: Secondary | ICD-10-CM

## 2018-06-03 DIAGNOSIS — M19012 Primary osteoarthritis, left shoulder: Secondary | ICD-10-CM | POA: Diagnosis not present

## 2018-06-04 DIAGNOSIS — C679 Malignant neoplasm of bladder, unspecified: Secondary | ICD-10-CM | POA: Diagnosis not present

## 2018-06-09 DIAGNOSIS — C61 Malignant neoplasm of prostate: Secondary | ICD-10-CM | POA: Diagnosis not present

## 2018-06-15 DIAGNOSIS — I10 Essential (primary) hypertension: Secondary | ICD-10-CM | POA: Diagnosis not present

## 2018-06-15 DIAGNOSIS — Z91041 Radiographic dye allergy status: Secondary | ICD-10-CM | POA: Diagnosis not present

## 2018-06-15 DIAGNOSIS — C61 Malignant neoplasm of prostate: Secondary | ICD-10-CM | POA: Diagnosis not present

## 2018-06-15 DIAGNOSIS — Z79899 Other long term (current) drug therapy: Secondary | ICD-10-CM | POA: Diagnosis not present

## 2018-06-15 DIAGNOSIS — M1990 Unspecified osteoarthritis, unspecified site: Secondary | ICD-10-CM | POA: Diagnosis not present

## 2018-06-19 ENCOUNTER — Telehealth: Payer: Self-pay | Admitting: Medical Oncology

## 2018-06-19 NOTE — Telephone Encounter (Signed)
Left message requesting a return call to discuss his treatment decision for prostate cancer.

## 2018-06-22 ENCOUNTER — Telehealth: Payer: Self-pay | Admitting: Medical Oncology

## 2018-06-22 NOTE — Telephone Encounter (Signed)
Mr. Taney returned my call to inform me he has decided on 5 days of  Cyber Knife at [facility] as treatment for his prostate cancer. He voiced his thanks for the respect, knowledge and care he has received here at [facility]. If for some reason the Cyber Knife does not work out, his second choice will  be 5 1/2 weeks of radiation here at [facility]. I wished him the best and asked him to call me if he should need to come back to see Dr. Tammi Klippel. He voiced understanding. I informed Dr. Tammi Klippel of the above.

## 2018-08-25 DIAGNOSIS — C61 Malignant neoplasm of prostate: Secondary | ICD-10-CM | POA: Diagnosis not present

## 2018-08-25 DIAGNOSIS — Z86010 Personal history of colonic polyps: Secondary | ICD-10-CM | POA: Diagnosis not present

## 2018-08-25 DIAGNOSIS — I10 Essential (primary) hypertension: Secondary | ICD-10-CM | POA: Diagnosis not present

## 2018-08-25 DIAGNOSIS — E782 Mixed hyperlipidemia: Secondary | ICD-10-CM | POA: Diagnosis not present

## 2018-09-25 DIAGNOSIS — Z1159 Encounter for screening for other viral diseases: Secondary | ICD-10-CM | POA: Diagnosis not present

## 2018-09-28 DIAGNOSIS — M1990 Unspecified osteoarthritis, unspecified site: Secondary | ICD-10-CM | POA: Diagnosis not present

## 2018-09-28 DIAGNOSIS — Z79899 Other long term (current) drug therapy: Secondary | ICD-10-CM | POA: Diagnosis not present

## 2018-09-28 DIAGNOSIS — K4020 Bilateral inguinal hernia, without obstruction or gangrene, not specified as recurrent: Secondary | ICD-10-CM | POA: Diagnosis not present

## 2018-09-28 DIAGNOSIS — I10 Essential (primary) hypertension: Secondary | ICD-10-CM | POA: Diagnosis not present

## 2018-09-28 DIAGNOSIS — Z91041 Radiographic dye allergy status: Secondary | ICD-10-CM | POA: Diagnosis not present

## 2018-09-28 DIAGNOSIS — C61 Malignant neoplasm of prostate: Secondary | ICD-10-CM | POA: Diagnosis not present

## 2018-10-08 DIAGNOSIS — Z79899 Other long term (current) drug therapy: Secondary | ICD-10-CM | POA: Diagnosis not present

## 2018-10-08 DIAGNOSIS — C61 Malignant neoplasm of prostate: Secondary | ICD-10-CM | POA: Diagnosis not present

## 2018-10-08 DIAGNOSIS — N400 Enlarged prostate without lower urinary tract symptoms: Secondary | ICD-10-CM | POA: Diagnosis not present

## 2018-10-08 DIAGNOSIS — R9341 Abnormal radiologic findings on diagnostic imaging of renal pelvis, ureter, or bladder: Secondary | ICD-10-CM | POA: Diagnosis not present

## 2018-10-08 DIAGNOSIS — K4020 Bilateral inguinal hernia, without obstruction or gangrene, not specified as recurrent: Secondary | ICD-10-CM | POA: Diagnosis not present

## 2018-10-08 DIAGNOSIS — M1990 Unspecified osteoarthritis, unspecified site: Secondary | ICD-10-CM | POA: Diagnosis not present

## 2018-10-08 DIAGNOSIS — I10 Essential (primary) hypertension: Secondary | ICD-10-CM | POA: Diagnosis not present

## 2018-10-08 DIAGNOSIS — Z91041 Radiographic dye allergy status: Secondary | ICD-10-CM | POA: Diagnosis not present

## 2018-10-21 DIAGNOSIS — Z841 Family history of disorders of kidney and ureter: Secondary | ICD-10-CM | POA: Diagnosis not present

## 2018-10-21 DIAGNOSIS — Z809 Family history of malignant neoplasm, unspecified: Secondary | ICD-10-CM | POA: Diagnosis not present

## 2018-10-21 DIAGNOSIS — H409 Unspecified glaucoma: Secondary | ICD-10-CM | POA: Diagnosis not present

## 2018-10-21 DIAGNOSIS — R609 Edema, unspecified: Secondary | ICD-10-CM | POA: Diagnosis not present

## 2018-10-21 DIAGNOSIS — I10 Essential (primary) hypertension: Secondary | ICD-10-CM | POA: Diagnosis not present

## 2018-10-21 DIAGNOSIS — Z87892 Personal history of anaphylaxis: Secondary | ICD-10-CM | POA: Diagnosis not present

## 2018-10-21 DIAGNOSIS — J309 Allergic rhinitis, unspecified: Secondary | ICD-10-CM | POA: Diagnosis not present

## 2018-10-21 DIAGNOSIS — N400 Enlarged prostate without lower urinary tract symptoms: Secondary | ICD-10-CM | POA: Diagnosis not present

## 2018-10-21 DIAGNOSIS — Z8249 Family history of ischemic heart disease and other diseases of the circulatory system: Secondary | ICD-10-CM | POA: Diagnosis not present

## 2018-10-21 DIAGNOSIS — C61 Malignant neoplasm of prostate: Secondary | ICD-10-CM | POA: Diagnosis not present

## 2018-10-22 DIAGNOSIS — C61 Malignant neoplasm of prostate: Secondary | ICD-10-CM | POA: Diagnosis not present

## 2018-10-22 DIAGNOSIS — I10 Essential (primary) hypertension: Secondary | ICD-10-CM | POA: Diagnosis not present

## 2018-10-22 DIAGNOSIS — M1990 Unspecified osteoarthritis, unspecified site: Secondary | ICD-10-CM | POA: Diagnosis not present

## 2018-10-22 DIAGNOSIS — Z79899 Other long term (current) drug therapy: Secondary | ICD-10-CM | POA: Diagnosis not present

## 2018-10-22 DIAGNOSIS — Z91041 Radiographic dye allergy status: Secondary | ICD-10-CM | POA: Diagnosis not present

## 2018-10-26 DIAGNOSIS — I10 Essential (primary) hypertension: Secondary | ICD-10-CM | POA: Diagnosis not present

## 2018-10-26 DIAGNOSIS — C61 Malignant neoplasm of prostate: Secondary | ICD-10-CM | POA: Diagnosis not present

## 2018-10-26 DIAGNOSIS — Z79899 Other long term (current) drug therapy: Secondary | ICD-10-CM | POA: Diagnosis not present

## 2018-10-26 DIAGNOSIS — M1990 Unspecified osteoarthritis, unspecified site: Secondary | ICD-10-CM | POA: Diagnosis not present

## 2018-10-26 DIAGNOSIS — Z91041 Radiographic dye allergy status: Secondary | ICD-10-CM | POA: Diagnosis not present

## 2018-10-27 DIAGNOSIS — Z79899 Other long term (current) drug therapy: Secondary | ICD-10-CM | POA: Diagnosis not present

## 2018-10-27 DIAGNOSIS — Z91041 Radiographic dye allergy status: Secondary | ICD-10-CM | POA: Diagnosis not present

## 2018-10-27 DIAGNOSIS — C61 Malignant neoplasm of prostate: Secondary | ICD-10-CM | POA: Diagnosis not present

## 2018-10-27 DIAGNOSIS — M1990 Unspecified osteoarthritis, unspecified site: Secondary | ICD-10-CM | POA: Diagnosis not present

## 2018-10-27 DIAGNOSIS — I10 Essential (primary) hypertension: Secondary | ICD-10-CM | POA: Diagnosis not present

## 2018-10-28 DIAGNOSIS — Z79899 Other long term (current) drug therapy: Secondary | ICD-10-CM | POA: Diagnosis not present

## 2018-10-28 DIAGNOSIS — Z91041 Radiographic dye allergy status: Secondary | ICD-10-CM | POA: Diagnosis not present

## 2018-10-28 DIAGNOSIS — C61 Malignant neoplasm of prostate: Secondary | ICD-10-CM | POA: Diagnosis not present

## 2018-10-28 DIAGNOSIS — I10 Essential (primary) hypertension: Secondary | ICD-10-CM | POA: Diagnosis not present

## 2018-10-28 DIAGNOSIS — M1990 Unspecified osteoarthritis, unspecified site: Secondary | ICD-10-CM | POA: Diagnosis not present

## 2018-10-29 DIAGNOSIS — Z79899 Other long term (current) drug therapy: Secondary | ICD-10-CM | POA: Diagnosis not present

## 2018-10-29 DIAGNOSIS — M1990 Unspecified osteoarthritis, unspecified site: Secondary | ICD-10-CM | POA: Diagnosis not present

## 2018-10-29 DIAGNOSIS — Z91041 Radiographic dye allergy status: Secondary | ICD-10-CM | POA: Diagnosis not present

## 2018-10-29 DIAGNOSIS — C61 Malignant neoplasm of prostate: Secondary | ICD-10-CM | POA: Diagnosis not present

## 2018-10-29 DIAGNOSIS — I10 Essential (primary) hypertension: Secondary | ICD-10-CM | POA: Diagnosis not present

## 2018-10-30 DIAGNOSIS — C61 Malignant neoplasm of prostate: Secondary | ICD-10-CM | POA: Diagnosis not present

## 2018-10-30 DIAGNOSIS — M1990 Unspecified osteoarthritis, unspecified site: Secondary | ICD-10-CM | POA: Diagnosis not present

## 2018-10-30 DIAGNOSIS — I10 Essential (primary) hypertension: Secondary | ICD-10-CM | POA: Diagnosis not present

## 2018-10-30 DIAGNOSIS — Z79899 Other long term (current) drug therapy: Secondary | ICD-10-CM | POA: Diagnosis not present

## 2018-10-30 DIAGNOSIS — Z91041 Radiographic dye allergy status: Secondary | ICD-10-CM | POA: Diagnosis not present

## 2018-11-30 DIAGNOSIS — M1990 Unspecified osteoarthritis, unspecified site: Secondary | ICD-10-CM | POA: Diagnosis not present

## 2018-11-30 DIAGNOSIS — I10 Essential (primary) hypertension: Secondary | ICD-10-CM | POA: Diagnosis not present

## 2018-11-30 DIAGNOSIS — Z79899 Other long term (current) drug therapy: Secondary | ICD-10-CM | POA: Diagnosis not present

## 2018-11-30 DIAGNOSIS — Z91041 Radiographic dye allergy status: Secondary | ICD-10-CM | POA: Diagnosis not present

## 2018-11-30 DIAGNOSIS — C61 Malignant neoplasm of prostate: Secondary | ICD-10-CM | POA: Diagnosis not present

## 2019-01-11 DIAGNOSIS — M545 Low back pain: Secondary | ICD-10-CM | POA: Diagnosis not present

## 2019-02-01 DIAGNOSIS — C61 Malignant neoplasm of prostate: Secondary | ICD-10-CM | POA: Diagnosis not present

## 2019-02-01 DIAGNOSIS — Z91041 Radiographic dye allergy status: Secondary | ICD-10-CM | POA: Diagnosis not present

## 2019-02-01 DIAGNOSIS — I10 Essential (primary) hypertension: Secondary | ICD-10-CM | POA: Diagnosis not present

## 2019-02-01 DIAGNOSIS — M1990 Unspecified osteoarthritis, unspecified site: Secondary | ICD-10-CM | POA: Diagnosis not present

## 2019-02-01 DIAGNOSIS — Z79899 Other long term (current) drug therapy: Secondary | ICD-10-CM | POA: Diagnosis not present

## 2019-02-24 DIAGNOSIS — Z86010 Personal history of colonic polyps: Secondary | ICD-10-CM | POA: Diagnosis not present

## 2019-02-24 DIAGNOSIS — C61 Malignant neoplasm of prostate: Secondary | ICD-10-CM | POA: Diagnosis not present

## 2019-02-24 DIAGNOSIS — Z0000 Encounter for general adult medical examination without abnormal findings: Secondary | ICD-10-CM | POA: Diagnosis not present

## 2019-02-24 DIAGNOSIS — E782 Mixed hyperlipidemia: Secondary | ICD-10-CM | POA: Diagnosis not present

## 2019-02-24 DIAGNOSIS — I10 Essential (primary) hypertension: Secondary | ICD-10-CM | POA: Diagnosis not present

## 2019-03-03 DIAGNOSIS — Z23 Encounter for immunization: Secondary | ICD-10-CM | POA: Diagnosis not present

## 2019-03-03 DIAGNOSIS — I10 Essential (primary) hypertension: Secondary | ICD-10-CM | POA: Diagnosis not present

## 2019-03-03 DIAGNOSIS — E782 Mixed hyperlipidemia: Secondary | ICD-10-CM | POA: Diagnosis not present

## 2019-06-07 DIAGNOSIS — C61 Malignant neoplasm of prostate: Secondary | ICD-10-CM | POA: Diagnosis not present

## 2019-06-07 DIAGNOSIS — E782 Mixed hyperlipidemia: Secondary | ICD-10-CM | POA: Diagnosis not present

## 2019-06-07 DIAGNOSIS — Z79899 Other long term (current) drug therapy: Secondary | ICD-10-CM | POA: Diagnosis not present

## 2019-09-27 DIAGNOSIS — Z86010 Personal history of colonic polyps: Secondary | ICD-10-CM | POA: Diagnosis not present

## 2019-09-27 DIAGNOSIS — C61 Malignant neoplasm of prostate: Secondary | ICD-10-CM | POA: Diagnosis not present

## 2019-09-27 DIAGNOSIS — E782 Mixed hyperlipidemia: Secondary | ICD-10-CM | POA: Diagnosis not present

## 2019-09-27 DIAGNOSIS — I10 Essential (primary) hypertension: Secondary | ICD-10-CM | POA: Diagnosis not present

## 2019-10-07 DIAGNOSIS — H401112 Primary open-angle glaucoma, right eye, moderate stage: Secondary | ICD-10-CM | POA: Diagnosis not present

## 2019-10-07 DIAGNOSIS — H5213 Myopia, bilateral: Secondary | ICD-10-CM | POA: Diagnosis not present

## 2019-10-07 DIAGNOSIS — H2512 Age-related nuclear cataract, left eye: Secondary | ICD-10-CM | POA: Diagnosis not present

## 2019-10-07 DIAGNOSIS — Z0100 Encounter for examination of eyes and vision without abnormal findings: Secondary | ICD-10-CM | POA: Diagnosis not present

## 2019-10-26 DIAGNOSIS — Z809 Family history of malignant neoplasm, unspecified: Secondary | ICD-10-CM | POA: Diagnosis not present

## 2019-10-26 DIAGNOSIS — M1990 Unspecified osteoarthritis, unspecified site: Secondary | ICD-10-CM | POA: Diagnosis not present

## 2019-10-26 DIAGNOSIS — Z8249 Family history of ischemic heart disease and other diseases of the circulatory system: Secondary | ICD-10-CM | POA: Diagnosis not present

## 2019-10-26 DIAGNOSIS — I10 Essential (primary) hypertension: Secondary | ICD-10-CM | POA: Diagnosis not present

## 2019-10-26 DIAGNOSIS — G4700 Insomnia, unspecified: Secondary | ICD-10-CM | POA: Diagnosis not present

## 2019-10-26 DIAGNOSIS — Z888 Allergy status to other drugs, medicaments and biological substances status: Secondary | ICD-10-CM | POA: Diagnosis not present

## 2019-10-26 DIAGNOSIS — Z8546 Personal history of malignant neoplasm of prostate: Secondary | ICD-10-CM | POA: Diagnosis not present

## 2019-10-26 DIAGNOSIS — H409 Unspecified glaucoma: Secondary | ICD-10-CM | POA: Diagnosis not present

## 2019-10-26 DIAGNOSIS — E785 Hyperlipidemia, unspecified: Secondary | ICD-10-CM | POA: Diagnosis not present

## 2019-10-26 DIAGNOSIS — Z008 Encounter for other general examination: Secondary | ICD-10-CM | POA: Diagnosis not present

## 2019-11-09 DIAGNOSIS — D225 Melanocytic nevi of trunk: Secondary | ICD-10-CM | POA: Diagnosis not present

## 2019-11-09 DIAGNOSIS — X32XXXD Exposure to sunlight, subsequent encounter: Secondary | ICD-10-CM | POA: Diagnosis not present

## 2019-11-09 DIAGNOSIS — L570 Actinic keratosis: Secondary | ICD-10-CM | POA: Diagnosis not present

## 2019-11-09 DIAGNOSIS — B078 Other viral warts: Secondary | ICD-10-CM | POA: Diagnosis not present

## 2019-11-09 DIAGNOSIS — L821 Other seborrheic keratosis: Secondary | ICD-10-CM | POA: Diagnosis not present

## 2019-11-22 DIAGNOSIS — H401112 Primary open-angle glaucoma, right eye, moderate stage: Secondary | ICD-10-CM | POA: Diagnosis not present

## 2020-04-04 DIAGNOSIS — H401112 Primary open-angle glaucoma, right eye, moderate stage: Secondary | ICD-10-CM | POA: Diagnosis not present

## 2020-04-15 IMAGING — XA IR FLUORO GUIDE NDL PLMT / BX
3 series · 8 of 8 positions shown · non-contrast
Comparison: none

INDICATION: Prostate carcinoma. Mixed lytic/sclerotic lesion involving T7
vertebral body.

[Series 2: care single · 1 of 1 slices shown (1 of 2)]
[im 1/1]
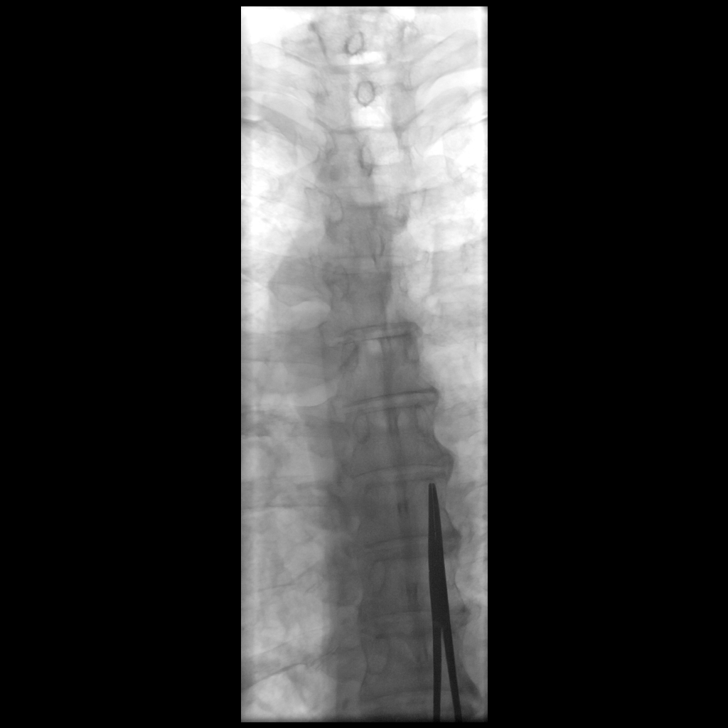

[Series 4: care single · 1 of 1 slices shown (2 of 2)]
[im 1/1]
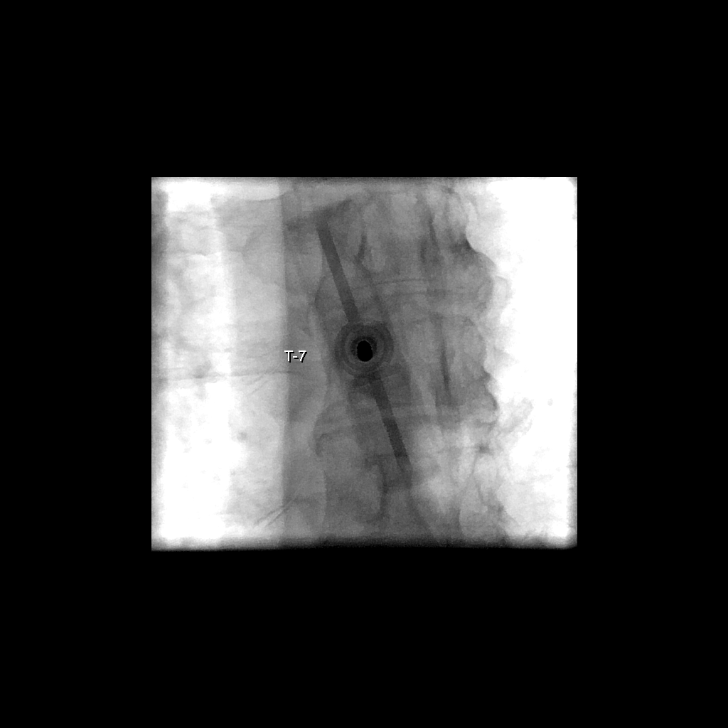

[Series 300: fl - angio · 6 of 6 slices shown]
[im 1/6]
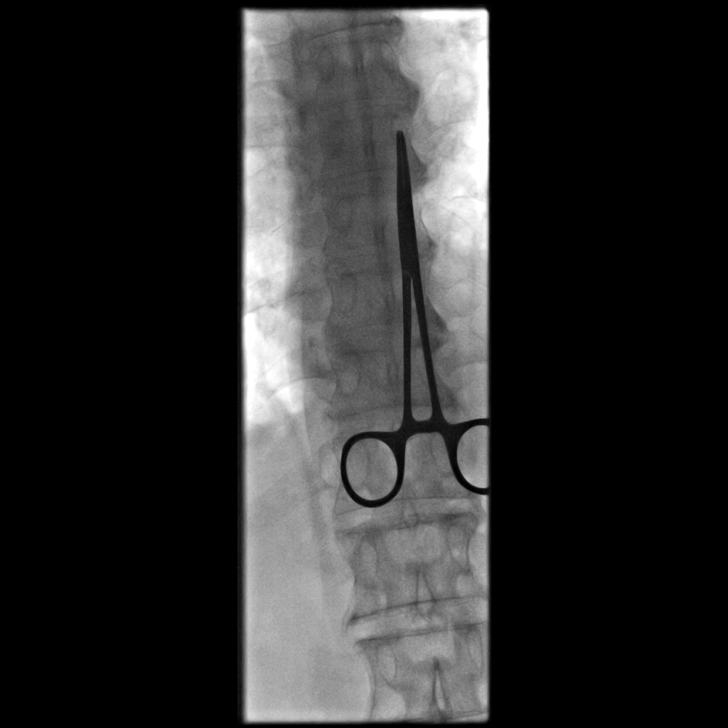
[im 2/6]
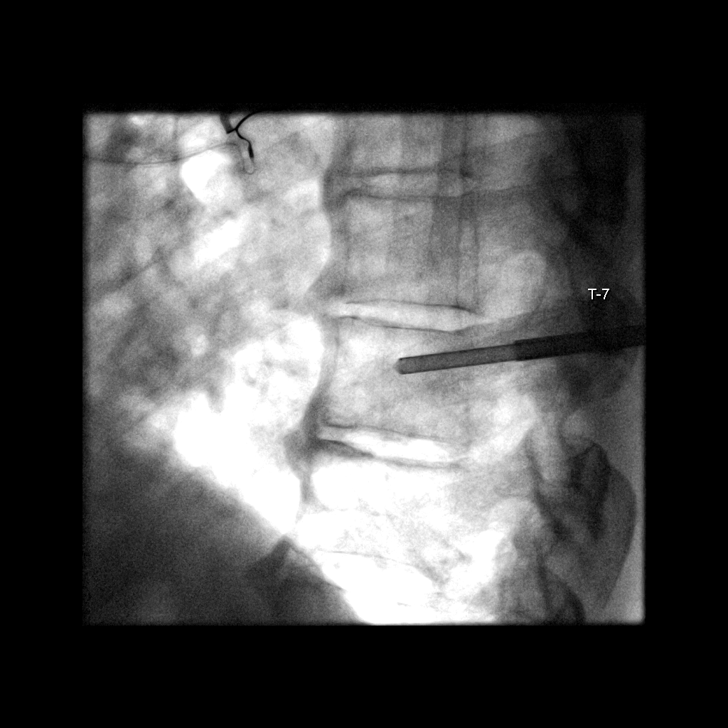
[im 3/6]
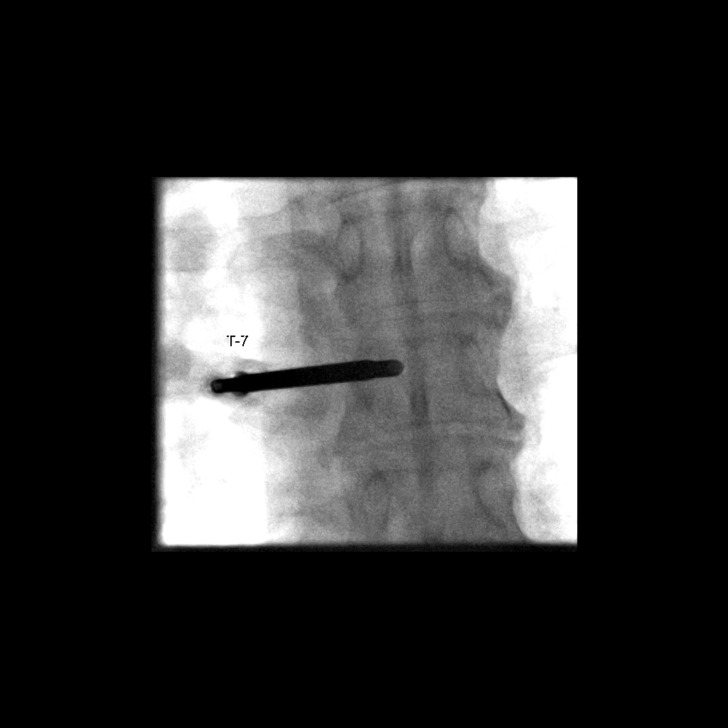
[im 4/6]
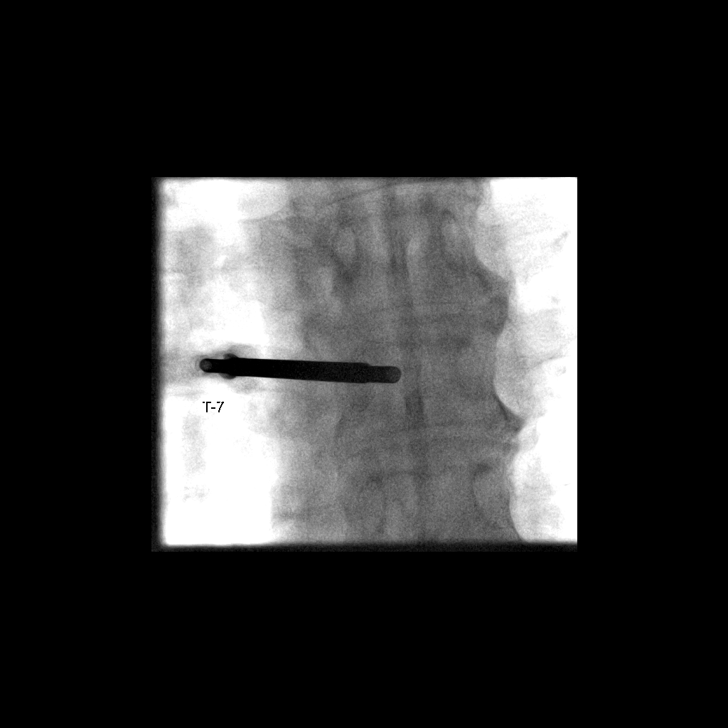
[im 5/6]
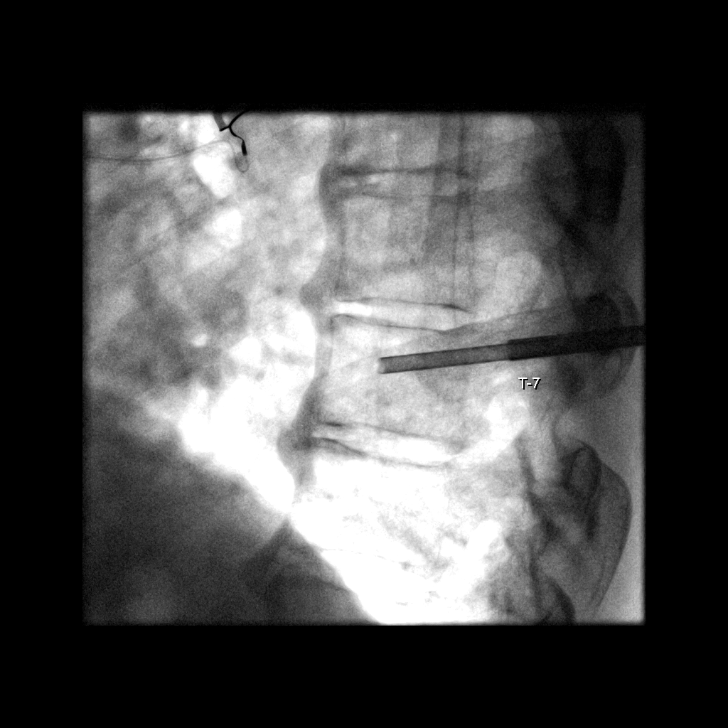
[im 6/6]
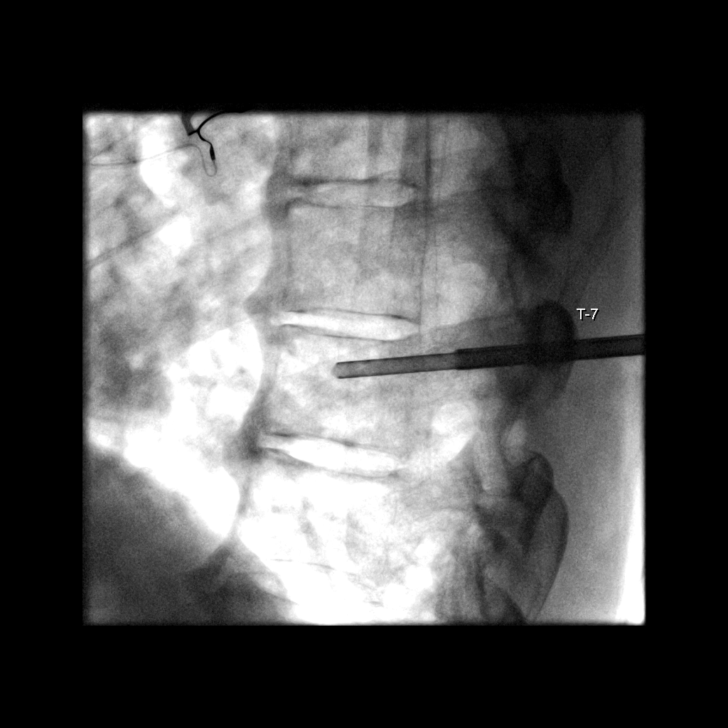

[8 of 8 positions shown; findings below may reference images not displayed]

EXAM:
THORACIC T7 VERTEBRAL BODY DEEP BONE LESION CORE BIOPSY UNDER
FLUOROSCOPY

MEDICATIONS:
No periprocedural antibiotics were indicated

ANESTHESIA/SEDATION:
Intravenous Fentanyl and Versed were administered as conscious
sedation during continuous monitoring of the patient's level of
consciousness and physiological / cardiorespiratory status by the
radiology RN, with a total moderate sedation time of 29 minutes.

PROCEDURE:
Informed written consent was obtained from the patient after a
thorough discussion of the procedural risks, benefits and
alternatives. All questions were addressed. Maximal Sterile Barrier
Technique was utilized including caps, mask, sterile gowns, sterile
gloves, sterile drape, hand hygiene and skin antiseptic. A timeout
was performed prior to the initiation of the procedure.

Patient was placed prone. Midthoracic region prepped with
chlorhexidine, draped in usual sterile fashion. The T7 level was
localized by counting from above and below which were concordant
with prior imaging. An appropriate skin entry site was determined
and infiltrated locally with 1% lidocaine. Jamshidi needle was
advanced into the anterior aspect of the left T7 pedicle. This was
exchanged over the pin for the 8 gauge trocar needle, through which
multiple core bone biopsy samples were obtained, submitted in
formalin to surgical pathology. Biopsy sampling positions confirmed
on AP and lateral fluoroscopy, correlated with prior CT imaging. The
guide needle was removed. The patient tolerated the procedure well.

FLUOROSCOPY TIME:  2 minutes; 961 uDymB DAP

COMPLICATIONS:
None immediate.
IMPRESSION: 1. Technically successful core biopsy of thoracic T7 vertebral body
deep bone lesion under fluoroscopic guidance.

## 2020-04-19 DIAGNOSIS — Z23 Encounter for immunization: Secondary | ICD-10-CM | POA: Diagnosis not present

## 2020-04-19 DIAGNOSIS — Z8546 Personal history of malignant neoplasm of prostate: Secondary | ICD-10-CM | POA: Diagnosis not present

## 2020-04-19 DIAGNOSIS — E782 Mixed hyperlipidemia: Secondary | ICD-10-CM | POA: Diagnosis not present

## 2020-04-19 DIAGNOSIS — Z0000 Encounter for general adult medical examination without abnormal findings: Secondary | ICD-10-CM | POA: Diagnosis not present

## 2020-04-19 DIAGNOSIS — Z86010 Personal history of colonic polyps: Secondary | ICD-10-CM | POA: Diagnosis not present

## 2020-04-19 DIAGNOSIS — I10 Essential (primary) hypertension: Secondary | ICD-10-CM | POA: Diagnosis not present

## 2020-04-19 DIAGNOSIS — Z1389 Encounter for screening for other disorder: Secondary | ICD-10-CM | POA: Diagnosis not present

## 2020-04-24 DIAGNOSIS — Z86010 Personal history of colonic polyps: Secondary | ICD-10-CM | POA: Diagnosis not present

## 2020-04-24 DIAGNOSIS — Z1159 Encounter for screening for other viral diseases: Secondary | ICD-10-CM | POA: Diagnosis not present

## 2020-04-24 DIAGNOSIS — Z8546 Personal history of malignant neoplasm of prostate: Secondary | ICD-10-CM | POA: Diagnosis not present

## 2020-06-30 DIAGNOSIS — H401112 Primary open-angle glaucoma, right eye, moderate stage: Secondary | ICD-10-CM | POA: Diagnosis not present

## 2020-07-11 HISTORY — PX: COLONOSCOPY W/ POLYPECTOMY: SHX1380

## 2020-07-26 DIAGNOSIS — Z01812 Encounter for preprocedural laboratory examination: Secondary | ICD-10-CM | POA: Diagnosis not present

## 2020-07-28 DIAGNOSIS — D123 Benign neoplasm of transverse colon: Secondary | ICD-10-CM | POA: Diagnosis not present

## 2020-07-28 DIAGNOSIS — K648 Other hemorrhoids: Secondary | ICD-10-CM | POA: Diagnosis not present

## 2020-07-28 DIAGNOSIS — Z86010 Personal history of colonic polyps: Secondary | ICD-10-CM | POA: Diagnosis not present

## 2020-07-28 DIAGNOSIS — D122 Benign neoplasm of ascending colon: Secondary | ICD-10-CM | POA: Diagnosis not present

## 2020-08-01 DIAGNOSIS — D122 Benign neoplasm of ascending colon: Secondary | ICD-10-CM | POA: Diagnosis not present

## 2020-08-01 DIAGNOSIS — D123 Benign neoplasm of transverse colon: Secondary | ICD-10-CM | POA: Diagnosis not present

## 2020-11-06 DIAGNOSIS — Z8546 Personal history of malignant neoplasm of prostate: Secondary | ICD-10-CM | POA: Diagnosis not present

## 2020-11-06 DIAGNOSIS — I10 Essential (primary) hypertension: Secondary | ICD-10-CM | POA: Diagnosis not present

## 2020-11-06 DIAGNOSIS — E782 Mixed hyperlipidemia: Secondary | ICD-10-CM | POA: Diagnosis not present

## 2020-11-16 ENCOUNTER — Ambulatory Visit: Payer: Self-pay | Admitting: Podiatry

## 2021-01-24 DIAGNOSIS — I10 Essential (primary) hypertension: Secondary | ICD-10-CM | POA: Diagnosis not present

## 2021-01-24 DIAGNOSIS — I951 Orthostatic hypotension: Secondary | ICD-10-CM | POA: Diagnosis not present

## 2021-01-24 DIAGNOSIS — E785 Hyperlipidemia, unspecified: Secondary | ICD-10-CM | POA: Diagnosis not present

## 2021-01-24 DIAGNOSIS — Z888 Allergy status to other drugs, medicaments and biological substances status: Secondary | ICD-10-CM | POA: Diagnosis not present

## 2021-01-24 DIAGNOSIS — Z882 Allergy status to sulfonamides status: Secondary | ICD-10-CM | POA: Diagnosis not present

## 2021-01-24 DIAGNOSIS — H409 Unspecified glaucoma: Secondary | ICD-10-CM | POA: Diagnosis not present

## 2021-01-24 DIAGNOSIS — G8929 Other chronic pain: Secondary | ICD-10-CM | POA: Diagnosis not present

## 2021-01-24 DIAGNOSIS — Z8546 Personal history of malignant neoplasm of prostate: Secondary | ICD-10-CM | POA: Diagnosis not present

## 2021-01-24 DIAGNOSIS — Z8249 Family history of ischemic heart disease and other diseases of the circulatory system: Secondary | ICD-10-CM | POA: Diagnosis not present

## 2021-01-24 DIAGNOSIS — M1990 Unspecified osteoarthritis, unspecified site: Secondary | ICD-10-CM | POA: Diagnosis not present

## 2021-01-24 DIAGNOSIS — N529 Male erectile dysfunction, unspecified: Secondary | ICD-10-CM | POA: Diagnosis not present

## 2021-01-24 DIAGNOSIS — J309 Allergic rhinitis, unspecified: Secondary | ICD-10-CM | POA: Diagnosis not present

## 2021-02-20 DIAGNOSIS — H2513 Age-related nuclear cataract, bilateral: Secondary | ICD-10-CM | POA: Diagnosis not present

## 2021-02-20 DIAGNOSIS — H401112 Primary open-angle glaucoma, right eye, moderate stage: Secondary | ICD-10-CM | POA: Diagnosis not present

## 2021-05-22 DIAGNOSIS — Z8546 Personal history of malignant neoplasm of prostate: Secondary | ICD-10-CM | POA: Diagnosis not present

## 2021-05-22 DIAGNOSIS — E782 Mixed hyperlipidemia: Secondary | ICD-10-CM | POA: Diagnosis not present

## 2021-05-22 DIAGNOSIS — I10 Essential (primary) hypertension: Secondary | ICD-10-CM | POA: Diagnosis not present

## 2021-05-22 DIAGNOSIS — Z23 Encounter for immunization: Secondary | ICD-10-CM | POA: Diagnosis not present

## 2021-05-22 DIAGNOSIS — Z0000 Encounter for general adult medical examination without abnormal findings: Secondary | ICD-10-CM | POA: Diagnosis not present

## 2021-06-05 DIAGNOSIS — H2513 Age-related nuclear cataract, bilateral: Secondary | ICD-10-CM | POA: Diagnosis not present

## 2021-06-05 DIAGNOSIS — H401112 Primary open-angle glaucoma, right eye, moderate stage: Secondary | ICD-10-CM | POA: Diagnosis not present

## 2021-06-06 DIAGNOSIS — L570 Actinic keratosis: Secondary | ICD-10-CM | POA: Diagnosis not present

## 2021-06-06 DIAGNOSIS — X32XXXD Exposure to sunlight, subsequent encounter: Secondary | ICD-10-CM | POA: Diagnosis not present

## 2021-06-06 DIAGNOSIS — C4442 Squamous cell carcinoma of skin of scalp and neck: Secondary | ICD-10-CM | POA: Diagnosis not present

## 2021-07-10 DIAGNOSIS — L308 Other specified dermatitis: Secondary | ICD-10-CM | POA: Diagnosis not present

## 2021-07-10 DIAGNOSIS — L821 Other seborrheic keratosis: Secondary | ICD-10-CM | POA: Diagnosis not present

## 2021-07-10 DIAGNOSIS — Z85828 Personal history of other malignant neoplasm of skin: Secondary | ICD-10-CM | POA: Diagnosis not present

## 2021-07-10 DIAGNOSIS — Z08 Encounter for follow-up examination after completed treatment for malignant neoplasm: Secondary | ICD-10-CM | POA: Diagnosis not present

## 2021-08-01 DIAGNOSIS — H2512 Age-related nuclear cataract, left eye: Secondary | ICD-10-CM | POA: Diagnosis not present

## 2021-08-01 DIAGNOSIS — H25812 Combined forms of age-related cataract, left eye: Secondary | ICD-10-CM | POA: Diagnosis not present

## 2021-11-19 DIAGNOSIS — E782 Mixed hyperlipidemia: Secondary | ICD-10-CM | POA: Diagnosis not present

## 2021-11-19 DIAGNOSIS — Z8546 Personal history of malignant neoplasm of prostate: Secondary | ICD-10-CM | POA: Diagnosis not present

## 2021-11-19 DIAGNOSIS — I10 Essential (primary) hypertension: Secondary | ICD-10-CM | POA: Diagnosis not present

## 2021-12-06 DIAGNOSIS — H401131 Primary open-angle glaucoma, bilateral, mild stage: Secondary | ICD-10-CM | POA: Diagnosis not present

## 2021-12-06 DIAGNOSIS — Z961 Presence of intraocular lens: Secondary | ICD-10-CM | POA: Diagnosis not present

## 2021-12-14 DIAGNOSIS — L570 Actinic keratosis: Secondary | ICD-10-CM | POA: Diagnosis not present

## 2021-12-14 DIAGNOSIS — X32XXXD Exposure to sunlight, subsequent encounter: Secondary | ICD-10-CM | POA: Diagnosis not present

## 2021-12-14 DIAGNOSIS — B078 Other viral warts: Secondary | ICD-10-CM | POA: Diagnosis not present

## 2022-01-30 DIAGNOSIS — M109 Gout, unspecified: Secondary | ICD-10-CM | POA: Diagnosis not present

## 2022-04-19 DIAGNOSIS — H401111 Primary open-angle glaucoma, right eye, mild stage: Secondary | ICD-10-CM | POA: Diagnosis not present

## 2022-06-25 DIAGNOSIS — Z8546 Personal history of malignant neoplasm of prostate: Secondary | ICD-10-CM | POA: Diagnosis not present

## 2022-06-25 DIAGNOSIS — I10 Essential (primary) hypertension: Secondary | ICD-10-CM | POA: Diagnosis not present

## 2022-06-25 DIAGNOSIS — N183 Chronic kidney disease, stage 3 unspecified: Secondary | ICD-10-CM | POA: Diagnosis not present

## 2022-06-25 DIAGNOSIS — Z0000 Encounter for general adult medical examination without abnormal findings: Secondary | ICD-10-CM | POA: Diagnosis not present

## 2022-06-25 DIAGNOSIS — Z1331 Encounter for screening for depression: Secondary | ICD-10-CM | POA: Diagnosis not present

## 2022-06-25 DIAGNOSIS — E782 Mixed hyperlipidemia: Secondary | ICD-10-CM | POA: Diagnosis not present

## 2022-07-11 DIAGNOSIS — M7731 Calcaneal spur, right foot: Secondary | ICD-10-CM | POA: Diagnosis not present

## 2022-07-11 DIAGNOSIS — M79671 Pain in right foot: Secondary | ICD-10-CM | POA: Diagnosis not present

## 2022-07-11 DIAGNOSIS — M71571 Other bursitis, not elsewhere classified, right ankle and foot: Secondary | ICD-10-CM | POA: Diagnosis not present

## 2022-07-11 DIAGNOSIS — M7661 Achilles tendinitis, right leg: Secondary | ICD-10-CM | POA: Diagnosis not present

## 2022-07-22 DIAGNOSIS — M7732 Calcaneal spur, left foot: Secondary | ICD-10-CM | POA: Diagnosis not present

## 2022-07-22 DIAGNOSIS — M71572 Other bursitis, not elsewhere classified, left ankle and foot: Secondary | ICD-10-CM | POA: Diagnosis not present

## 2022-07-22 DIAGNOSIS — M7662 Achilles tendinitis, left leg: Secondary | ICD-10-CM | POA: Diagnosis not present

## 2022-07-26 DIAGNOSIS — H401131 Primary open-angle glaucoma, bilateral, mild stage: Secondary | ICD-10-CM | POA: Diagnosis not present

## 2022-07-29 DIAGNOSIS — M25572 Pain in left ankle and joints of left foot: Secondary | ICD-10-CM | POA: Diagnosis not present

## 2022-07-29 DIAGNOSIS — M10079 Idiopathic gout, unspecified ankle and foot: Secondary | ICD-10-CM | POA: Diagnosis not present

## 2022-07-29 DIAGNOSIS — M7752 Other enthesopathy of left foot: Secondary | ICD-10-CM | POA: Diagnosis not present

## 2022-08-05 DIAGNOSIS — M25572 Pain in left ankle and joints of left foot: Secondary | ICD-10-CM | POA: Diagnosis not present

## 2022-08-05 DIAGNOSIS — M7752 Other enthesopathy of left foot: Secondary | ICD-10-CM | POA: Diagnosis not present

## 2022-08-19 DIAGNOSIS — M25572 Pain in left ankle and joints of left foot: Secondary | ICD-10-CM | POA: Diagnosis not present

## 2022-10-02 DIAGNOSIS — L304 Erythema intertrigo: Secondary | ICD-10-CM | POA: Diagnosis not present

## 2022-10-02 DIAGNOSIS — B078 Other viral warts: Secondary | ICD-10-CM | POA: Diagnosis not present

## 2022-10-29 DIAGNOSIS — H401131 Primary open-angle glaucoma, bilateral, mild stage: Secondary | ICD-10-CM | POA: Diagnosis not present

## 2022-10-31 DIAGNOSIS — H0015 Chalazion left lower eyelid: Secondary | ICD-10-CM | POA: Diagnosis not present

## 2022-11-18 DIAGNOSIS — M10071 Idiopathic gout, right ankle and foot: Secondary | ICD-10-CM | POA: Diagnosis not present

## 2022-11-18 DIAGNOSIS — M25571 Pain in right ankle and joints of right foot: Secondary | ICD-10-CM | POA: Diagnosis not present

## 2022-11-18 DIAGNOSIS — M10072 Idiopathic gout, left ankle and foot: Secondary | ICD-10-CM | POA: Diagnosis not present

## 2022-11-18 DIAGNOSIS — M10079 Idiopathic gout, unspecified ankle and foot: Secondary | ICD-10-CM | POA: Diagnosis not present

## 2022-11-18 DIAGNOSIS — M25572 Pain in left ankle and joints of left foot: Secondary | ICD-10-CM | POA: Diagnosis not present

## 2022-12-24 DIAGNOSIS — E782 Mixed hyperlipidemia: Secondary | ICD-10-CM | POA: Diagnosis not present

## 2022-12-24 DIAGNOSIS — R079 Chest pain, unspecified: Secondary | ICD-10-CM | POA: Diagnosis not present

## 2022-12-24 DIAGNOSIS — N183 Chronic kidney disease, stage 3 unspecified: Secondary | ICD-10-CM | POA: Diagnosis not present

## 2022-12-24 DIAGNOSIS — M109 Gout, unspecified: Secondary | ICD-10-CM | POA: Diagnosis not present

## 2022-12-24 DIAGNOSIS — Z8546 Personal history of malignant neoplasm of prostate: Secondary | ICD-10-CM | POA: Diagnosis not present

## 2022-12-24 DIAGNOSIS — I10 Essential (primary) hypertension: Secondary | ICD-10-CM | POA: Diagnosis not present

## 2023-01-20 ENCOUNTER — Other Ambulatory Visit: Payer: Self-pay | Admitting: Ophthalmology

## 2023-01-20 DIAGNOSIS — L820 Inflamed seborrheic keratosis: Secondary | ICD-10-CM | POA: Diagnosis not present

## 2023-01-23 LAB — DERMATOLOGY PATHOLOGY

## 2023-02-20 DIAGNOSIS — H401111 Primary open-angle glaucoma, right eye, mild stage: Secondary | ICD-10-CM | POA: Diagnosis not present

## 2023-02-24 ENCOUNTER — Ambulatory Visit: Payer: Medicare HMO | Attending: Cardiology | Admitting: Cardiology

## 2023-02-24 ENCOUNTER — Encounter: Payer: Self-pay | Admitting: Cardiology

## 2023-02-24 VITALS — BP 154/78 | HR 100 | Ht 71.0 in | Wt 217.2 lb

## 2023-02-24 DIAGNOSIS — E785 Hyperlipidemia, unspecified: Secondary | ICD-10-CM | POA: Diagnosis not present

## 2023-02-24 DIAGNOSIS — R079 Chest pain, unspecified: Secondary | ICD-10-CM

## 2023-02-24 DIAGNOSIS — I10 Essential (primary) hypertension: Secondary | ICD-10-CM

## 2023-02-24 NOTE — Assessment & Plan Note (Signed)
Infrequent episodes of chest pain, described as dull and constant, radiating to the neck. Pain is not exertional and typically resolves within 10 minutes. EKG showed no evidence of ischemia. Discussed the differential diagnosis of angina vs gallbladder disease. Had a long discussion of diagnostic options ranging from ETT/GXT, Myoview/SPECT or cardiac PET stress test versus coronary CTA. Coronary CTA would be a favorable option with exception of the fact that he had a history of hives with contrast media therefore we will try to avoid that if possible.  -Plan for a treadmill stress test (GXT/ETT) to evaluate for exercise-induced ischemia.  See Shared Decision Making/Informed Consent

## 2023-02-24 NOTE — Assessment & Plan Note (Signed)
Blood pressure readings borderline high. Currently on Triamterene-HCTZ. -Continue current medication and monitor blood pressure.  HYPERTENSION CONTROL Vitals:   02/24/23 1554 02/24/23 1610  BP: (!) 170/88 (!) 154/78    The patient's blood pressure is elevated above target today. In order to address the patient's elevated BP: Blood pressure will be monitored at home to determine if medication changes need to be made. (Monitor.  Home blood pressures were 135/80 and 140/82 this week.  If still elevated at follow-up, would likely add medication based on heart rate and other symptoms.)

## 2023-02-24 NOTE — Progress Notes (Signed)
Cardiology Office Note:  .   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 (notably Dr. Manus Gunning recently retired and he will be establishing with a new PCP  Opal Sidles was seen on December 24, 2022 by PCP for routine follow-up and health maintenance..=> Noted intermittent episodes of chest pain.  Referred to cardiology as it seemed concerning for angina, albeit not exertional.     Subjective   INTERVAL HPI Discussed the use of AI scribe software for clinical note transcription with the patient, who gave verbal consent to proceed.  History of Present Illness   The patient, a middle-aged individual with a history of hypertension, glaucoma, gout, and prostate cancer, was referred by his primary care physician due to concerns of chest pain initially thought to be gallstones. The patient reported experiencing infrequent episodes of dull, constant chest pain, rated as 2-3 on a pain scale, located centrally and radiating to the neck. These episodes, which have been occurring 2-3 times a year for the past 8-9 years, typically resolve within a few minutes but have lasted up to 30 minutes on one occasion. The patient noted that these episodes seem to occur randomly and are not associated with exertion, meals, or any identifiable triggers.  The patient's hypertension is managed with triamterene-HCTZ, and he has been monitoring his blood pressure at home, typically recording readings of 135/75 in the evenings. He also takes rosuvastatin  for cholesterol management, which has resulted in good cholesterol levels. For glaucoma, the patient is on Latanoprost and dorzolamide eye drops. He also takes allopurinol for gout management.  The patient described himself as moderately active, engaging in yard work and mowing the lawn with a Firefighter. He denied experiencing shortness of breath during these activities or at rest, and also denied any symptoms of dizziness, irregular heartbeats, or blood in stools or urine.    He reported a past episode of hives following a contrast dye procedure.      Cardiovascular ROS: positive for - chest pain and the 0 rare intermittent episodes that can last anywhere from 1 or 2 minutes up to 30 minutes.  Not exertional.  Usually occur at rest.  The most prominent episode of 8 out of 10 pain lasting 30 minutes occurred while driving.  Not associate with certain movements. -> Otherwise no rest or exertional dyspnea.  No exertional chest pain.  No PND orthopnea or edema.  Rare palpitations but no rapid irregular heartbeats.  No syncope or near syncope.  No TIA or amaurosis fugax.  No claudication.  ROS:  Review of Systems - Negative except symptoms noted above.     Objective   Past Medical History Hypertension - managed with triamterene-HCTZ 37.5/25 mg CKD III Glaucoma: Currently managed with Latanoprost and Dorzolamide eye drops. Gout: Recent episode in March. Currently on Allopurinol for maintenance, with Colchicine discontinued. Gallstones / Symptomatic cholelithiasis - > 2017  PSH Prostate Cancer --> prostate cancer treatment approximately four and a half  years ago  Colon Polyp - s/p Colonoscopy & polypectomy (07/2020) Tonsillectomy & wisdom teeth extraction.  He also had cataract surgeries in 2017 and 2023.  Social History Currently single.  Retired Pensions consultant.  No children.  2-3 drinks of alcohol a week.  Never smoked.  Family History Both of his parents lived into their late 62s, with the  father (died @ 48) having a history of angina and the mother (died ~ 42) having hypertension, CKD. Neither parent had a history of heart attacks or stents.  Studies Reviewed: Shane Myers      EKG not performed today. EKG from PCP dated 12/24/2022:  Rate: 82;  Rhythm: normal sinus rhythm; Q waves noted in III only.  Not consistent with inferior infarct, age-indeterminate.  Otherwise normal axis, intervals and durations.  Narrative Interpretation: Normal EKG   No studies Labs from PCP dated 12/24/2022 Na+ 143, K+ 3.8, Cl- 104, HCO3-32, BUN 21, Cr 1.33, Glu 101, Ca2+ 9.3; AST 23, ALT 37, AlkP 90; T. bili 0.8, ALB 4.2.  T protein 6.3. TC 134, TG 111, HDL 40, LDL 74   Risk Assessment/Calculations:     HYPERTENSION CONTROL Vitals:   02/24/23 1554 02/24/23 1610  BP: (!) 170/88 (!) 154/78    The patient's blood pressure is elevated above target today.  In order to address the patient's elevated BP: Blood pressure will be monitored at home to determine if medication changes need to be made. (Monitor.  Home blood pressures were 135/80 and 140/82 this week.  If still elevated at follow-up, would likely add medication based on heart rate and other symptoms.)           Physical Exam:   VS:  BP (!) 154/78   Pulse 100   Ht 5\' 11"  (1.803 m)   Wt 217 lb 3.2 oz (98.5 kg)   SpO2 97%   BMI 30.29 kg/m    Wt Readings from Last 3 Encounters:  02/24/23 217 lb 3.2 oz (98.5 kg)  05/12/18 210 lb 9.6 oz (95.5 kg)  10/04/16 220 lb (99.8 kg)    GEN: Well nourished, well developed in no acute distress; healthy-appearing.  Well-nourished and well-groomed. NECK: No JVD; No carotid bruits CARDIAC: Normal S1, S2; RRR, no murmurs, rubs, gallops RESPIRATORY:  Clear to auscultation without rales, wheezing or rhonchi ; nonlabored, good air movement. ABDOMEN: Soft, non-tender, non-distended EXTREMITIES:  No edema; No deformity      ASSESSMENT AND PLAN: .    Problem List Items Addressed This Visit        Cardiology Problems   Essential hypertension (Chronic)    Blood pressure readings borderline high. Currently on Triamterene-HCTZ. -Continue current medication and monitor blood pressure.  HYPERTENSION CONTROL Vitals:   02/24/23 1554 02/24/23 1610  BP: (!) 170/88 (!) 154/78    The patient's blood pressure is elevated above target today. In order to address the patient's elevated BP: Blood pressure will be monitored at home to determine if medication changes need to be made. (Monitor.  Home blood pressures were 135/80 and 140/82 this week.  If still elevated at follow-up, would likely add medication based on heart rate and other symptoms.)          Other   Chest pain of uncertain etiology - Primary    Infrequent episodes of chest pain, described as dull and constant, radiating to the neck. Pain is not exertional and typically resolves within 10 minutes. EKG showed no evidence of ischemia. Discussed the differential diagnosis of angina  vs gallbladder disease. Had a long discussion of diagnostic options ranging from ETT/GXT, Myoview/SPECT or cardiac PET stress test versus coronary CTA. Coronary CTA would be a favorable option with exception of the fact that he had a history of hives with contrast media therefore we will try to avoid that if possible.  -Plan for a treadmill stress test (GXT/ETT) to evaluate for exercise-induced ischemia.  See Shared Decision Making/Informed Consent      Relevant Orders   Cardiac Stress Test: Informed Consent Details: Physician/Practitioner Attestation; Transcribe to consent form and obtain patient signature   EXERCISE TOLERANCE TEST (ETT)   Hyperlipidemia, acquired (Chronic)    Currently managed with Rosuvastatin 10mg  daily. Recent cholesterol levels within normal limits. -Continue Rosuvastatin 10mg  daily.      Other Visit Diagnoses     Hypertension, unspecified type       Relevant Orders   EKG 12-Lead   Cardiac Stress Test: Informed Consent  Details: Physician/Practitioner Attestation; Transcribe to consent form and obtain patient signature       General Health Maintenance -Continue current medications and follow-up with primary care physician in January.          Informed Consent   Shared Decision Making/Informed Consent The risks [chest pain, shortness of breath, cardiac arrhythmias, dizziness, blood pressure fluctuations, myocardial infarction, stroke/transient ischemic attack, and life-threatening complications (estimated to be 1 in 10,000)], benefits (risk stratification, diagnosing coronary artery disease, treatment guidance) and alternatives of an exercise tolerance test were discussed in detail with Mr. Beek and he agrees to proceed.      Dispo: Return in about 3 months (around 05/27/2023) for 3-4 month follow-up.  Total time spent: 41 min spent with patient + 30 min spent charting = 71 min    Signed, Marykay Lex, MD, MS Bryan Lemma, M.D., M.S. Interventional Cardiologist  [facility]  Pager # [phone] Phone # [phone] [address] [place], [place] [address]

## 2023-02-24 NOTE — Assessment & Plan Note (Signed)
Currently managed with Rosuvastatin 10mg  daily. Recent cholesterol levels within normal limits. -Continue Rosuvastatin 10mg  daily.

## 2023-02-24 NOTE — Patient Instructions (Addendum)
Medication Instructions:   No changes   *If you need a refill on your cardiac medications before your next appointment, please call your pharmacy*   Lab Work: Not needed    Testing/Procedures:  Your physician has requested that you have an exercise tolerance test. Please also follow instruction sheet, as given. This will take place at [address] , [address]. Do not drink or eat foods with caffeine for 24 hours before the test. (Chocolate, coffee, tea, or energy drinks) If you use an inhaler, bring it with you to the test. Do not smoke for 4 hours before the test. Wear comfortable shoes and clothing.    Follow-Up: At [facility], you and your health needs are our priority.  As part of our continuing mission to provide you with exceptional heart care, we have created designated Provider Care Teams.  These Care Teams include your primary Cardiologist (physician) and Advanced Practice Providers (APPs -  Physician Assistants and Nurse Practitioners) who all work together to provide you with the care you need, when you need it.     Your next appointment:   3 month(s)  The format for your next appointment:   In Person  Provider:    Dr Bryan Lemma

## 2023-04-09 DIAGNOSIS — H0279 Other degenerative disorders of eyelid and periocular area: Secondary | ICD-10-CM | POA: Diagnosis not present

## 2023-04-09 DIAGNOSIS — D485 Neoplasm of uncertain behavior of skin: Secondary | ICD-10-CM | POA: Diagnosis not present

## 2023-04-09 DIAGNOSIS — Z01818 Encounter for other preprocedural examination: Secondary | ICD-10-CM | POA: Diagnosis not present

## 2023-04-16 DIAGNOSIS — J989 Respiratory disorder, unspecified: Secondary | ICD-10-CM | POA: Diagnosis not present

## 2023-04-16 DIAGNOSIS — I10 Essential (primary) hypertension: Secondary | ICD-10-CM | POA: Diagnosis not present

## 2023-04-16 DIAGNOSIS — R0981 Nasal congestion: Secondary | ICD-10-CM | POA: Diagnosis not present

## 2023-06-02 DIAGNOSIS — M10071 Idiopathic gout, right ankle and foot: Secondary | ICD-10-CM | POA: Diagnosis not present

## 2023-06-02 DIAGNOSIS — M25571 Pain in right ankle and joints of right foot: Secondary | ICD-10-CM | POA: Diagnosis not present

## 2023-06-02 DIAGNOSIS — M10072 Idiopathic gout, left ankle and foot: Secondary | ICD-10-CM | POA: Diagnosis not present

## 2023-06-02 DIAGNOSIS — M25572 Pain in left ankle and joints of left foot: Secondary | ICD-10-CM | POA: Diagnosis not present

## 2023-06-02 DIAGNOSIS — M10079 Idiopathic gout, unspecified ankle and foot: Secondary | ICD-10-CM | POA: Diagnosis not present

## 2023-06-10 DIAGNOSIS — L820 Inflamed seborrheic keratosis: Secondary | ICD-10-CM | POA: Diagnosis not present

## 2023-06-24 DIAGNOSIS — H04123 Dry eye syndrome of bilateral lacrimal glands: Secondary | ICD-10-CM | POA: Diagnosis not present

## 2023-06-24 DIAGNOSIS — H401131 Primary open-angle glaucoma, bilateral, mild stage: Secondary | ICD-10-CM | POA: Diagnosis not present

## 2023-07-01 DIAGNOSIS — L570 Actinic keratosis: Secondary | ICD-10-CM | POA: Diagnosis not present

## 2023-07-01 DIAGNOSIS — C44329 Squamous cell carcinoma of skin of other parts of face: Secondary | ICD-10-CM | POA: Diagnosis not present

## 2023-07-01 DIAGNOSIS — X32XXXD Exposure to sunlight, subsequent encounter: Secondary | ICD-10-CM | POA: Diagnosis not present

## 2023-07-01 DIAGNOSIS — L820 Inflamed seborrheic keratosis: Secondary | ICD-10-CM | POA: Diagnosis not present

## 2023-07-15 DIAGNOSIS — Z85828 Personal history of other malignant neoplasm of skin: Secondary | ICD-10-CM | POA: Diagnosis not present

## 2023-07-15 DIAGNOSIS — Z08 Encounter for follow-up examination after completed treatment for malignant neoplasm: Secondary | ICD-10-CM | POA: Diagnosis not present

## 2023-07-21 DIAGNOSIS — I10 Essential (primary) hypertension: Secondary | ICD-10-CM | POA: Diagnosis not present

## 2023-07-21 DIAGNOSIS — E782 Mixed hyperlipidemia: Secondary | ICD-10-CM | POA: Diagnosis not present

## 2023-07-21 DIAGNOSIS — Z8546 Personal history of malignant neoplasm of prostate: Secondary | ICD-10-CM | POA: Diagnosis not present

## 2023-09-10 DIAGNOSIS — Z09 Encounter for follow-up examination after completed treatment for conditions other than malignant neoplasm: Secondary | ICD-10-CM | POA: Diagnosis not present

## 2023-09-10 DIAGNOSIS — K5730 Diverticulosis of large intestine without perforation or abscess without bleeding: Secondary | ICD-10-CM | POA: Diagnosis not present

## 2023-09-10 DIAGNOSIS — D125 Benign neoplasm of sigmoid colon: Secondary | ICD-10-CM | POA: Diagnosis not present

## 2023-09-10 DIAGNOSIS — Z860101 Personal history of adenomatous and serrated colon polyps: Secondary | ICD-10-CM | POA: Diagnosis not present

## 2023-09-12 DIAGNOSIS — D125 Benign neoplasm of sigmoid colon: Secondary | ICD-10-CM | POA: Diagnosis not present

## 2023-09-23 DIAGNOSIS — Z85828 Personal history of other malignant neoplasm of skin: Secondary | ICD-10-CM | POA: Diagnosis not present

## 2023-09-23 DIAGNOSIS — Z08 Encounter for follow-up examination after completed treatment for malignant neoplasm: Secondary | ICD-10-CM | POA: Diagnosis not present

## 2023-09-23 DIAGNOSIS — L820 Inflamed seborrheic keratosis: Secondary | ICD-10-CM | POA: Diagnosis not present

## 2023-09-23 DIAGNOSIS — C44519 Basal cell carcinoma of skin of other part of trunk: Secondary | ICD-10-CM | POA: Diagnosis not present

## 2023-10-09 ENCOUNTER — Telehealth: Payer: Self-pay | Admitting: Podiatry

## 2023-10-09 NOTE — Telephone Encounter (Signed)
 Patient needs refill for  coltasin, pharmacy states needs something from provider. Shane Myers at [facility].

## 2023-10-14 ENCOUNTER — Ambulatory Visit: Admitting: Podiatry

## 2023-10-14 ENCOUNTER — Encounter: Payer: Self-pay | Admitting: Podiatry

## 2023-10-14 DIAGNOSIS — M25572 Pain in left ankle and joints of left foot: Secondary | ICD-10-CM | POA: Diagnosis not present

## 2023-10-14 MED ORDER — TRIAMCINOLONE ACETONIDE 10 MG/ML IJ SUSP
10.0000 mg | Freq: Once | INTRAMUSCULAR | Status: AC
Start: 2023-10-14 — End: 2023-10-14
  Administered 2023-10-14: 10 mg

## 2023-10-14 MED ORDER — COLCHICINE 0.6 MG PO TABS: 0.6000 mg | ORAL_TABLET | Freq: Every day | ORAL | 0 refills | Status: AC

## 2023-10-14 NOTE — Progress Notes (Signed)
 Complaint of pain around the first metatarsal phalangeal joint of the left foot.  He had some Achilles tendinitis about a week ago and that went away within the joint flared up.  He has been out of colchicine in the past week.  He is also currently taking allopurinol 100 mg p.o. daily   Physical exam:  General appearance: Pleasant, and in no acute distress. AOx3.  Vascular: Pedal pulses: DP 2/4, PT 2/4.  Lysed edema around the first metatarsal phalangeal joint left foot capillary fill time immediate.  Neurological: Light touch intact feet bilaterally.  Normal Achilles reflex bilaterally.  No clonus or spasticity noted.   Dermatologic:   Redness around first metatarsal phalangeal joint of the left foot.  Slightly warm.  Musculoskeletal: Tenderness to palpation and range of motion at the first metatarsal phalangeal joint of the left foot.  Tenderness palpation of the Achilles tendon.  Radiographs: none  Diagnosis: 1 arthralgia first metatarsophalangeal joint left foot. 2.  Acute gout left foot.  Plan: -Discussed with him the acute gout flare.  Will check his uric acid to see if it is elevated.  We may need to increase his allopurinol.  Will also refill his colchicine prescription. - Rx colchicine 0.6 mg, 1 p.o. daily.  Refill x 6 -Order for blood draw on the labs to check uric acid - Injected 3cc 2:1 mixture 0.5 cc Marcaine:Kenolog 10mg /47ml at metatarsophalangeal joint left foot..     Return 1 follow-up injection first MTP and discussed labs

## 2023-10-15 ENCOUNTER — Other Ambulatory Visit: Payer: Self-pay | Admitting: Podiatry

## 2023-10-15 DIAGNOSIS — M25572 Pain in left ankle and joints of left foot: Secondary | ICD-10-CM | POA: Diagnosis not present

## 2023-10-16 LAB — URIC ACID: Uric Acid: 6 mg/dL (ref 3.8–8.4)

## 2023-10-17 ENCOUNTER — Ambulatory Visit: Admitting: Podiatry

## 2023-10-22 ENCOUNTER — Encounter: Payer: Self-pay | Admitting: Podiatry

## 2023-10-22 ENCOUNTER — Ambulatory Visit: Admitting: Podiatry

## 2023-10-22 DIAGNOSIS — M1A00X0 Idiopathic chronic gout, unspecified site, without tophus (tophi): Secondary | ICD-10-CM

## 2023-10-22 DIAGNOSIS — M25572 Pain in left ankle and joints of left foot: Secondary | ICD-10-CM | POA: Diagnosis not present

## 2023-10-22 MED ORDER — ALLOPURINOL 300 MG PO TABS: 300.0000 mg | ORAL_TABLET | Freq: Every day | ORAL | 6 refills | Status: DC

## 2023-10-22 MED ORDER — COLCHICINE 0.6 MG PO TABS: 0.6000 mg | ORAL_TABLET | Freq: Every day | ORAL | 6 refills | Status: AC

## 2023-10-22 NOTE — Progress Notes (Signed)
 For follow-up gout flare first metatarsal phalangeal joint left foot.  He is doing much better now with very little discomfort.  Patient has not noticed any redness.  No symptoms of the right foot anterior   Physical exam:  General appearance: Pleasant, and in no acute distress. AOx3.  Vascular: Pedal pulses: DP 2/4 bilaterally, PT 2 by/4 bilaterally.  Mild edema lower legs bilaterally. Capillary fill time immediate.  Neurological: Light touch intact feet bilaterally.    Dermatologic:   Skin normal temperature bilaterally.  Skin normal color, tone, and texture bilaterally.   Musculoskeletal: Slight tenderness dorsal aspect first metatarsal phalangeal joint left foot.  No tenderness with range of motion.  Less synovial swelling than previous visit.    Diagnosis: 1.  Arthralgia first metatarsophalangeal joint left. 2.  Chronic gout feet.  Plan: -Established office visit level 3 for evaluation and management. - Recommend using Voltaren gel and over-the-counter NSAIDs for any pain in the first metatarsal phalangeal joint. - Discussed with him that his uric acid  6.0.  Given the callus flares we will increase allopurinol to 300 mg p.o. daily. -Rx allopurinol 300 mg 1 p.o. daily, refill x 6 - Rx colchicine  0.6 mg 1 p.o. daily, refill x 6  Return 6 mon f/u gout

## 2023-11-11 DIAGNOSIS — B078 Other viral warts: Secondary | ICD-10-CM | POA: Diagnosis not present

## 2023-11-11 DIAGNOSIS — C44519 Basal cell carcinoma of skin of other part of trunk: Secondary | ICD-10-CM | POA: Diagnosis not present

## 2023-12-02 ENCOUNTER — Ambulatory Visit: Admitting: Podiatry

## 2023-12-08 DIAGNOSIS — C44519 Basal cell carcinoma of skin of other part of trunk: Secondary | ICD-10-CM | POA: Diagnosis not present

## 2024-01-19 DIAGNOSIS — C44519 Basal cell carcinoma of skin of other part of trunk: Secondary | ICD-10-CM | POA: Diagnosis not present

## 2024-01-23 ENCOUNTER — Emergency Department (HOSPITAL_BASED_OUTPATIENT_CLINIC_OR_DEPARTMENT_OTHER)

## 2024-01-23 ENCOUNTER — Emergency Department (HOSPITAL_BASED_OUTPATIENT_CLINIC_OR_DEPARTMENT_OTHER)
Admission: EM | Admit: 2024-01-23 | Discharge: 2024-01-23 | Disposition: A | Discharge status: Home / Self Care | Attending: Emergency Medicine | Admitting: Emergency Medicine

## 2024-01-23 ENCOUNTER — Encounter (HOSPITAL_BASED_OUTPATIENT_CLINIC_OR_DEPARTMENT_OTHER): Payer: Self-pay | Admitting: *Deleted

## 2024-01-23 ENCOUNTER — Other Ambulatory Visit: Payer: Self-pay

## 2024-01-23 DIAGNOSIS — Z85828 Personal history of other malignant neoplasm of skin: Secondary | ICD-10-CM | POA: Diagnosis not present

## 2024-01-23 DIAGNOSIS — Z5189 Encounter for other specified aftercare: Secondary | ICD-10-CM

## 2024-01-23 DIAGNOSIS — Z4801 Encounter for change or removal of surgical wound dressing: Secondary | ICD-10-CM | POA: Diagnosis not present

## 2024-01-23 DIAGNOSIS — Z4800 Encounter for change or removal of nonsurgical wound dressing: Secondary | ICD-10-CM | POA: Diagnosis not present

## 2024-01-23 DIAGNOSIS — R2232 Localized swelling, mass and lump, left upper limb: Secondary | ICD-10-CM | POA: Diagnosis not present

## 2024-01-23 DIAGNOSIS — M7989 Other specified soft tissue disorders: Secondary | ICD-10-CM | POA: Insufficient documentation

## 2024-01-23 MED ORDER — DOXYCYCLINE HYCLATE 100 MG PO CAPS: 100.0000 mg | ORAL_CAPSULE | Freq: Two times a day (BID) | ORAL | 0 refills | Status: AC

## 2024-01-23 NOTE — ED Triage Notes (Signed)
 Pt is here for evaluation of left hand swelling since Tuesday.  Pt had surgery Monday and his surgeon advised him to come here for evaluation of left hand swelling and r/o blood clot.  No sob and no CP.

## 2024-01-23 NOTE — ED Notes (Signed)
 Discharge instructions, follow up care, and prescription reviewed and explained, pt verbalized understanding and had no further questions on d/c. Pt caox4, ambulatory, NAD on d/c.

## 2024-01-23 NOTE — Discharge Instructions (Signed)
 No DVT was found on your ultrasound today.  Overall your surgical site appears well.  To be conservative on a put you on some antibiotics to treat for may be mild infection around the wound margins.  Follow-up with your surgeon.  Return if symptoms worsen.  Try to keep her arm elevated when you can which might help with swelling

## 2024-01-23 NOTE — ED Provider Notes (Signed)
 [place] EMERGENCY DEPARTMENT AT [facility] Provider Note   CSN: [id number] Arrival date & time: 01/23/24  1204     Patient presents with: Hand Problem (swelling)   Shane Myers is a 73 y.o. male.   Patient sent here by his doctor for ultrasound of his left upper arm.  He had a cancerous mass removed from his left upper back recently.  He states that he has not really been lifting his arm up above his head and has been really careful about keeping his arm down.  He has not had any fevers or chills.  He has not noticed any drainage or purulence coming from his surgical site and in the back.  Denies any chest pain shortness of breath weakness numbness tingling.  Denies any trauma.  The history is provided by the patient.       Prior to Admission medications   Medication Sig Start Date End Date Taking? Authorizing Provider  doxycycline  (VIBRAMYCIN ) 100 MG capsule Take 1 capsule (100 mg total) by mouth 2 (two) times daily. 01/23/24  Yes Woodford Strege, DO  allopurinol  (ZYLOPRIM ) 100 MG tablet  07/12/22   [provider]  allopurinol  (ZYLOPRIM ) 300 MG tablet Take 1 tablet (300 mg total) by mouth daily. 10/22/23   Christine Rush, DPM  colchicine  0.6 MG tablet  07/12/22   [provider]  colchicine  0.6 MG tablet Take 1 tablet (0.6 mg total) by mouth daily. 10/14/23 11/13/23  Christine Rush, DPM  colchicine  0.6 MG tablet Take 1 tablet (0.6 mg total) by mouth daily. 10/22/23   Christine Rush, DPM  dorzolamide-timolol (COSOPT) 2-0.5 % ophthalmic solution SMARTSIG:In Eye(s) 06/26/23   [provider]  latanoprost (XALATAN) 0.005 % ophthalmic solution SMARTSIG:In Eye(s)    [provider]  rosuvastatin (CRESTOR) 10 MG tablet Take 10 mg by mouth at bedtime. 08/21/23   [provider]  timolol (TIMOPTIC) 0.5 % ophthalmic solution Place 1 drop into the right eye at bedtime.     [provider]  triamterene-hydrochlorothiazide (DYAZIDE) 37.5-25 MG  capsule Take 1 capsule by mouth daily.    [provider]  triamterene-hydrochlorothiazide (MAXZIDE-25) 37.5-25 MG tablet Take 1 tablet by mouth daily.    [provider]    Allergies: Ivp dye [iodinated contrast media], Amlodipine, and Lisinopril    Review of Systems  Updated Vital Signs BP (!) 171/88   Pulse (!) 104   Temp (!) 97.5 F (36.4 C)   Resp 14   SpO2 99%   Physical Exam Vitals and nursing note reviewed.  Constitutional:      General: He is not in acute distress.    Appearance: He is well-developed. He is not ill-appearing.  HENT:     Head: Normocephalic and atraumatic.     Nose: Nose normal.     Mouth/Throat:     Mouth: Mucous membranes are moist.  Eyes:     Extraocular Movements: Extraocular movements intact.     Conjunctiva/sclera: Conjunctivae normal.     Pupils: Pupils are equal, round, and reactive to light.  Cardiovascular:     Rate and Rhythm: Normal rate and regular rhythm.     Pulses: Normal pulses.     Heart sounds: Normal heart sounds. No murmur heard. Pulmonary:     Effort: Pulmonary effort is normal. No respiratory distress.     Breath sounds: Normal breath sounds.  Abdominal:     Palpations: Abdomen is soft.     Tenderness: There is no abdominal  tenderness.  Musculoskeletal:        General: No tenderness. Normal range of motion.     Cervical back: Normal range of motion and neck supple.     Comments: Maybe some trace swelling to the left upper extremity but there is no redness or cellulitic changes that tracked from his surgical site to his back  Skin:    General: Skin is warm and dry.     Capillary Refill: Capillary refill takes less than 2 seconds.     Comments: Surgical site in the left upper back overall with no major purulent drainage little bit of redness around the wound margins  Neurological:     General: No focal deficit present.     Mental Status: He is alert.  Psychiatric:        Mood and Affect: Mood normal.      (all labs ordered are listed, but only abnormal results are displayed) Labs Reviewed - No data to display  EKG: None  Radiology: US  Venous Img Upper Left (DVT Study) Result Date: 01/23/2024 EXAM: US  Duplex Left Upper Extremity Veins. TECHNIQUE: Real-time ultrasound scan of the veins of the left upper extremity with color Doppler flow, spectral waveform analysis and compression. COMPARISON: None. CLINICAL HISTORY: Swelling arm. FINDINGS: SUPERFICIAL VEINS: The cephalic and basilic veins are compressible, and demonstrate normal color Doppler flow. DEEP VEINS: The internal jugular, subclavian, axillary and brachial veins are compressible, and demonstrate normal color Doppler flow. SOFT TISSUES: No acute finding. IMPRESSION: 1. No deep venous thrombosis in the left upper extremity. Electronically signed by: Lonni Necessary MD 01/23/2024 01:27 PM EDT RP Workstation: [id number]     Procedures   Medications Ordered in the ED - No data to display                                  Medical Decision Making Risk Prescription drug management.   Shane Myers is here with left upper arm swelling.  Unremarkable vitals.  No major medical problems except for skin cancer.  Had removal of mass off his upper back here recently.  Wound margins overall look well with a little bit of redness.  Is no purulent drainage.  Conservatively will put him on antibiotics to help prevent/treat any mild infection there might be in this area.  This is being managed by outpatient doctor.  He has follow-up in a couple days to have sutures removed.  Ultimately he was sent for ultrasound of his left upper arm because he has some swelling now in his left upper extremity but this appears very mild.  It almost looks like more dependent edema in the left hand.  But I have no concern for cellulitis or infectious process in the left upper extremity.  He is got normal pulses.  No concern for arterial process.  He is not having  any pain in his hand or his arm.  I doubt any traumatic process.  Ultimately I did obtain ultrasound that was normal.  I do think that the mild swelling in his left upper arm is likely from dependent edema as patient has not really lifted his arm up above his head and has been really cautious about keeping his arm down because he does want a pull at the stitches in his left upper back.  Ultimately will put him on antibiotics.  He will continue his wound management at home.  To follow-up with his  primary doctor.  Patient discharged.  This chart was dictated using voice recognition software.  Despite best efforts to proofread,  errors can occur which can change the documentation meaning.      Final diagnoses:  Visit for wound check  Left arm swelling    ED Discharge Orders          Ordered    doxycycline  (VIBRAMYCIN ) 100 MG capsule  2 times daily        01/23/24 1344               [place], Juliene, DO 01/23/24 1416

## 2024-02-19 ENCOUNTER — Encounter (HOSPITAL_BASED_OUTPATIENT_CLINIC_OR_DEPARTMENT_OTHER): Payer: Self-pay

## 2024-02-19 ENCOUNTER — Other Ambulatory Visit: Payer: Self-pay

## 2024-02-19 ENCOUNTER — Emergency Department (HOSPITAL_BASED_OUTPATIENT_CLINIC_OR_DEPARTMENT_OTHER)

## 2024-02-19 ENCOUNTER — Emergency Department (HOSPITAL_BASED_OUTPATIENT_CLINIC_OR_DEPARTMENT_OTHER)
Admission: EM | Admit: 2024-02-19 | Discharge: 2024-02-19 | Disposition: A | Discharge status: Home / Self Care | Source: Ambulatory Visit | Attending: Emergency Medicine | Admitting: Emergency Medicine

## 2024-02-19 DIAGNOSIS — I82812 Embolism and thrombosis of superficial veins of left lower extremities: Secondary | ICD-10-CM | POA: Diagnosis not present

## 2024-02-19 DIAGNOSIS — M79662 Pain in left lower leg: Secondary | ICD-10-CM | POA: Diagnosis not present

## 2024-02-19 DIAGNOSIS — I8002 Phlebitis and thrombophlebitis of superficial vessels of left lower extremity: Secondary | ICD-10-CM | POA: Diagnosis not present

## 2024-02-19 DIAGNOSIS — I10 Essential (primary) hypertension: Secondary | ICD-10-CM | POA: Insufficient documentation

## 2024-02-19 DIAGNOSIS — Z79899 Other long term (current) drug therapy: Secondary | ICD-10-CM | POA: Insufficient documentation

## 2024-02-19 DIAGNOSIS — M7989 Other specified soft tissue disorders: Secondary | ICD-10-CM | POA: Diagnosis not present

## 2024-02-19 DIAGNOSIS — L03116 Cellulitis of left lower limb: Secondary | ICD-10-CM | POA: Diagnosis not present

## 2024-02-19 DIAGNOSIS — Z8546 Personal history of malignant neoplasm of prostate: Secondary | ICD-10-CM | POA: Diagnosis not present

## 2024-02-19 DIAGNOSIS — R2242 Localized swelling, mass and lump, left lower limb: Secondary | ICD-10-CM | POA: Diagnosis not present

## 2024-02-19 NOTE — ED Provider Notes (Signed)
  Physical Exam  BP (!) 155/76 (BP Location: Right Arm)   Pulse 87   Temp 98.7 F (37.1 C)   Resp 15   SpO2 95%   Physical Exam  Procedures  Procedures  ED Course / MDM    Medical Decision Making  Calf tenderness, sent to ED for r/o DVT Onset 1 day ago No risk factors or history of DVT No cellulitis changes  Pending DVT study Plan: if negative, can be discharged home  Doppler study positive for superficial vein thrombus. Patient advised of diagnosis, treatment including warm compresses, single aspirin daily, elevate the leg. Return precautions discussed.        Odell Balls, PA-C 02/19/24 2116    Randol Simmonds, MD 02/20/24 [id number]

## 2024-02-19 NOTE — ED Triage Notes (Signed)
 Pt c/o pain in LLE onset yesterday morning, seen at & sent from walk-in clinic to r/o DVT. Pt was advised by walk-in clinic that it's either an infection or a DVT. Denies additional/ sick symptoms

## 2024-02-19 NOTE — ED Provider Notes (Signed)
 [place] EMERGENCY DEPARTMENT AT [facility] Provider Note   CSN: [id number] Arrival date & time: 02/19/24  1710     Patient presents with: Leg Swelling (L)   Shane Myers is a 73 y.o. male.   72 year old male presenting with left lower extremity pain/redness.  Patient woke up with point tenderness to the medial aspect of his left calf yesterday, describes pain as constant and sharp without radiation.  There is some mild surrounding erythema which he notes as well.  He has noted some swelling to his left ankle that seems to be improved, this has been going on for at least 1 week.  History of gout, on allopurinol , does not associate this with his typical gout flare type symptoms.  He was seen at a walk-in clinic and told to present to the emergency department to rule out DVT.  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  Patient was seen several weeks ago for swelling to his left upper extremity, had DVT ultrasound study completed at that time which was negative.  No personal history of DVT/PE.        Prior to Admission medications   Medication Sig Start Date End Date Taking? Authorizing Provider  allopurinol  (ZYLOPRIM ) 100 MG tablet  07/12/22   [provider]  allopurinol  (ZYLOPRIM ) 300 MG tablet Take 1 tablet (300 mg total) by mouth daily. 10/22/23   Christine Rush, DPM  colchicine  0.6 MG tablet  07/12/22   [provider]  colchicine  0.6 MG tablet Take 1 tablet (0.6 mg total) by mouth daily. 10/14/23 11/13/23  Christine Rush, DPM  colchicine  0.6 MG tablet Take 1 tablet (0.6 mg total) by mouth daily. 10/22/23   Christine Rush, DPM  dorzolamide-timolol (COSOPT) 2-0.5 % ophthalmic solution SMARTSIG:In Eye(s) 06/26/23   [provider]  doxycycline  (VIBRAMYCIN ) 100 MG capsule Take 1 capsule (100 mg total) by mouth 2 (two) times daily.  01/23/24   [place], Adam, DO  latanoprost (XALATAN) 0.005 % ophthalmic solution SMARTSIG:In Eye(s)    [provider]  rosuvastatin (CRESTOR) 10 MG tablet Take 10 mg by mouth at bedtime. 08/21/23   [provider]  timolol (TIMOPTIC) 0.5 % ophthalmic solution Place 1 drop into the right eye at bedtime.     [provider]  triamterene-hydrochlorothiazide (DYAZIDE) 37.5-25 MG capsule Take 1 capsule by mouth daily.    [provider]  triamterene-hydrochlorothiazide (MAXZIDE-25) 37.5-25 MG tablet Take 1 tablet by mouth daily.    [provider]    Allergies: Ivp dye [iodinated contrast media], Amlodipine, and Lisinopril    Review of Systems  Updated Vital Signs BP (!) 155/76 (BP Location: Right Arm)   Pulse 87   Temp 98.7 F (37.1 C)   Resp 15   SpO2 95%   Physical Exam Vitals and nursing note reviewed.  HENT:     Head: Normocephalic.  Eyes:     Extraocular Movements: Extraocular movements intact.  Cardiovascular:     Rate and Rhythm: Normal rate.  Pulmonary:     Effort: Pulmonary effort is normal.  Musculoskeletal:     Cervical back: Normal range of motion.     Right lower leg: No edema.     Left lower leg: No edema.     Comments: Moves all extremities spontaneously without difficulty LLE: Point tenderness noted to medial aspect of calf with associated erythema. (-) Homan's. 2+ DP  pulse.  Skin:    General: Skin is warm and dry.     Comments: Mild erythema noted to medial aspect of L calf, no appreciable warmth, no area of fluctuance  Neurological:     Mental Status: He is alert and oriented to person, place, and time.     (all labs ordered are listed, but only abnormal results are displayed) Labs Reviewed - No data to display  EKG: None  Radiology: No results found.   Procedures   Medications Ordered in the ED - No data to display                                  Medical Decision Making This patient presents to  the ED for concern of left lower extremity pain/redness, this involves an extensive number of treatment options, and is a complaint that carries with it a high risk of complications and morbidity.  The differential diagnosis includes DVT, cellulitis, erysipelas, muscle pain/strain/sprain   Co morbidities that complicate the patient evaluation  Hypertension, hyperlipidemia, history of prostate cancer   Additional history obtained:  Additional history obtained from record review External records from outside source obtained and reviewed including previous ED note   Imaging Studies ordered:  I ordered imaging studies including LLE DVT US   Pending at time of shift change    Cardiac Monitoring: / EKG:  The patient was maintained on a cardiac monitor.  I personally viewed and interpreted the cardiac monitored which showed an underlying rhythm of: NSR   Problem List / ED Course / Critical interventions / Medication management I have reviewed the patients home medicines and have made adjustments as needed   Test / Admission - Considered:  Physical exam notable as above, patient has mild left lower extremity erythema with associated point tenderness, low suspicion for cellulitis based on this presentation but new onset of pain/erythema does raise suspicion for DVT.  No history of DVT/PE, recent Moh's procedure ~4 weeks ago but otherwise no recent surgery/immobilization or travel. DVT ultrasound study pending at time of shift change.  Patient handed off to PA-C Margit Paris pending DVT US  results, see their note for assessment/plan/dispo.           Final diagnoses:  None    ED Discharge Orders     None          Glendia Rocky SAILOR, [place] 02/19/24 1832    Randol Simmonds, MD 02/20/24 1410

## 2024-02-19 NOTE — Discharge Instructions (Signed)
 As we discussed, the clot is in a superficial vein and can be treated with warm compresses, elevation of the leg, single aspirin (325 mg daily). If symptoms persist, have your doctor recheck the area in the next 4-5 days.   If symptoms worsen - severe pain, significant lower extremity swelling - return to the ED for further evaluation.

## 2024-02-24 DIAGNOSIS — H401111 Primary open-angle glaucoma, right eye, mild stage: Secondary | ICD-10-CM | POA: Diagnosis not present

## 2024-02-24 DIAGNOSIS — Z961 Presence of intraocular lens: Secondary | ICD-10-CM | POA: Diagnosis not present

## 2024-02-24 DIAGNOSIS — Z48817 Encounter for surgical aftercare following surgery on the skin and subcutaneous tissue: Secondary | ICD-10-CM | POA: Diagnosis not present

## 2024-02-26 DIAGNOSIS — I82890 Acute embolism and thrombosis of other specified veins: Secondary | ICD-10-CM | POA: Diagnosis not present

## 2024-02-26 DIAGNOSIS — M25432 Effusion, left wrist: Secondary | ICD-10-CM | POA: Diagnosis not present

## 2024-02-26 DIAGNOSIS — M109 Gout, unspecified: Secondary | ICD-10-CM | POA: Diagnosis not present

## 2024-03-16 DIAGNOSIS — M109 Gout, unspecified: Secondary | ICD-10-CM | POA: Diagnosis not present

## 2024-03-16 DIAGNOSIS — H9190 Unspecified hearing loss, unspecified ear: Secondary | ICD-10-CM | POA: Diagnosis not present

## 2024-03-16 DIAGNOSIS — E782 Mixed hyperlipidemia: Secondary | ICD-10-CM | POA: Diagnosis not present

## 2024-03-16 DIAGNOSIS — N1831 Chronic kidney disease, stage 3a: Secondary | ICD-10-CM | POA: Diagnosis not present

## 2024-03-16 DIAGNOSIS — Z8546 Personal history of malignant neoplasm of prostate: Secondary | ICD-10-CM | POA: Diagnosis not present

## 2024-03-16 DIAGNOSIS — I10 Essential (primary) hypertension: Secondary | ICD-10-CM | POA: Diagnosis not present

## 2024-04-12 DIAGNOSIS — L928 Other granulomatous disorders of the skin and subcutaneous tissue: Secondary | ICD-10-CM | POA: Diagnosis not present

## 2024-04-12 DIAGNOSIS — Z48817 Encounter for surgical aftercare following surgery on the skin and subcutaneous tissue: Secondary | ICD-10-CM | POA: Diagnosis not present

## 2024-04-12 DIAGNOSIS — L905 Scar conditions and fibrosis of skin: Secondary | ICD-10-CM | POA: Diagnosis not present

## 2024-04-15 NOTE — Progress Notes (Addendum)
 TYSON PARKISON                                          MRN: [id number]   05/25/2024   The [facility] Quality Team Specialist reviewed this patient medical record for the purposes of chart review for care gap closure. The following were reviewed: chart review for care gap closure-controlling blood pressure.    [facility] Quality Team

## 2024-05-17 ENCOUNTER — Telehealth: Payer: Self-pay | Admitting: Podiatry

## 2024-05-17 NOTE — Telephone Encounter (Signed)
 Patient called to inform provider he is symptom free and would like to know if he needs to see you for anything else

## 2024-05-20 ENCOUNTER — Other Ambulatory Visit: Payer: Self-pay | Admitting: Podiatry

## 2024-06-10 ENCOUNTER — Other Ambulatory Visit: Payer: Self-pay | Admitting: Podiatry

## 2024-06-10 MED ORDER — COLCHICINE 0.6 MG PO TABS: 0.6000 mg | ORAL_TABLET | Freq: Every day | ORAL | 0 refills | Status: AC
